# Patient Record
Sex: Female | Born: 1980 | Race: Black or African American | Hispanic: No | Smoking: Never smoker
Health system: Southern US, Community
[De-identification: ages and names within clinical notes are randomized; demographics above are authoritative.]

## PROBLEM LIST (undated history)

## (undated) DIAGNOSIS — N912 Amenorrhea, unspecified: Secondary | ICD-10-CM

## (undated) DIAGNOSIS — D352 Benign neoplasm of pituitary gland: Secondary | ICD-10-CM

## (undated) DIAGNOSIS — I8390 Asymptomatic varicose veins of unspecified lower extremity: Secondary | ICD-10-CM

## (undated) HISTORY — DX: Amenorrhea, unspecified: N91.2

## (undated) HISTORY — PX: TONSILLECTOMY AND ADENOIDECTOMY: SHX28

## (undated) HISTORY — DX: Benign neoplasm of pituitary gland: D35.2

## (undated) HISTORY — PX: TONSILLECTOMY AND ADENOIDECTOMY: SUR1326

## (undated) HISTORY — DX: Asymptomatic varicose veins of unspecified lower extremity: I83.90

---
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
## 2001-10-22 ENCOUNTER — Emergency Department (HOSPITAL_COMMUNITY): Admission: EM | Admit: 2001-10-22 | Discharge: 2001-10-22 | Payer: Self-pay | Admitting: Emergency Medicine

## 2001-10-24 ENCOUNTER — Emergency Department (HOSPITAL_COMMUNITY): Admission: EM | Admit: 2001-10-24 | Discharge: 2001-10-24 | Payer: Self-pay | Admitting: *Deleted

## 2001-11-12 ENCOUNTER — Emergency Department (HOSPITAL_COMMUNITY): Admission: EM | Admit: 2001-11-12 | Discharge: 2001-11-12 | Payer: Self-pay | Admitting: Emergency Medicine

## 2006-06-07 ENCOUNTER — Ambulatory Visit: Payer: Self-pay | Admitting: Cardiology

## 2006-06-19 ENCOUNTER — Ambulatory Visit: Payer: Self-pay

## 2014-06-15 ENCOUNTER — Other Ambulatory Visit (HOSPITAL_COMMUNITY)
Admission: RE | Admit: 2014-06-15 | Discharge: 2014-06-15 | Disposition: A | Discharge status: Home / Self Care | Payer: Managed Care, Other (non HMO) | Source: Ambulatory Visit | Attending: Family Medicine | Admitting: Family Medicine

## 2014-06-15 ENCOUNTER — Ambulatory Visit (INDEPENDENT_AMBULATORY_CARE_PROVIDER_SITE_OTHER): Payer: Managed Care, Other (non HMO) | Admitting: Family Medicine

## 2014-06-15 ENCOUNTER — Encounter: Payer: Self-pay | Admitting: Family Medicine

## 2014-06-15 VITALS — BP 108/72 | HR 88 | Temp 99.4°F | Ht <= 58 in | Wt 181.0 lb

## 2014-06-15 DIAGNOSIS — Z01419 Encounter for gynecological examination (general) (routine) without abnormal findings: Secondary | ICD-10-CM | POA: Insufficient documentation

## 2014-06-15 DIAGNOSIS — Z23 Encounter for immunization: Secondary | ICD-10-CM

## 2014-06-15 DIAGNOSIS — R55 Syncope and collapse: Secondary | ICD-10-CM | POA: Diagnosis not present

## 2014-06-15 DIAGNOSIS — Z0000 Encounter for general adult medical examination without abnormal findings: Secondary | ICD-10-CM | POA: Diagnosis not present

## 2014-06-15 DIAGNOSIS — R8290 Unspecified abnormal findings in urine: Secondary | ICD-10-CM | POA: Diagnosis not present

## 2014-06-15 DIAGNOSIS — N912 Amenorrhea, unspecified: Secondary | ICD-10-CM

## 2014-06-15 NOTE — Patient Instructions (Signed)
Preventive Care for Adults A healthy lifestyle and preventive care can promote health and wellness. Preventive health guidelines for women include the following key practices.  A routine yearly physical is a good way to check with your health care provider about your health and preventive screening. It is a chance to share any concerns and updates on your health and to receive a thorough exam.  Visit your dentist for a routine exam and preventive care every 6 months. Brush your teeth twice a day and floss once a day. Good oral hygiene prevents tooth decay and gum disease.  The frequency of eye exams is based on your age, health, family medical history, use of contact lenses, and other factors. Follow your health care provider's recommendations for frequency of eye exams.  Eat a healthy diet. Foods like vegetables, fruits, whole grains, low-fat dairy products, and lean protein foods contain the nutrients you need without too many calories. Decrease your intake of foods high in solid fats, added sugars, and salt. Eat the right amount of calories for you.Get information about a proper diet from your health care provider, if necessary.  Regular physical exercise is one of the most important things you can do for your health. Most adults should get at least 150 minutes of moderate-intensity exercise (any activity that increases your heart rate and causes you to sweat) each week. In addition, most adults need muscle-strengthening exercises on 2 or more days a week.  Maintain a healthy weight. The body mass index (BMI) is a screening tool to identify possible weight problems. It provides an estimate of body fat based on height and weight. Your health care provider can find your BMI and can help you achieve or maintain a healthy weight.For adults 20 years and older:  A BMI below 18.5 is considered underweight.  A BMI of 18.5 to 24.9 is normal.  A BMI of 25 to 29.9 is considered overweight.  A BMI of  30 and above is considered obese.  Maintain normal blood lipids and cholesterol levels by exercising and minimizing your intake of saturated fat. Eat a balanced diet with plenty of fruit and vegetables. Blood tests for lipids and cholesterol should begin at age 76 and be repeated every 5 years. If your lipid or cholesterol levels are high, you are over 50, or you are at high risk for heart disease, you may need your cholesterol levels checked more frequently.Ongoing high lipid and cholesterol levels should be treated with medicines if diet and exercise are not working.  If you smoke, find out from your health care provider how to quit. If you do not use tobacco, do not start.  Lung cancer screening is recommended for adults aged 22-80 years who are at high risk for developing lung cancer because of a history of smoking. A yearly low-dose CT scan of the lungs is recommended for people who have at least a 30-pack-year history of smoking and are a current smoker or have quit within the past 15 years. A pack year of smoking is smoking an average of 1 pack of cigarettes a day for 1 year (for example: 1 pack a day for 30 years or 2 packs a day for 15 years). Yearly screening should continue until the smoker has stopped smoking for at least 15 years. Yearly screening should be stopped for people who develop a health problem that would prevent them from having lung cancer treatment.  If you are pregnant, do not drink alcohol. If you are breastfeeding,  be very cautious about drinking alcohol. If you are not pregnant and choose to drink alcohol, do not have more than 1 drink per day. One drink is considered to be 12 ounces (355 mL) of beer, 5 ounces (148 mL) of wine, or 1.5 ounces (44 mL) of liquor.  Avoid use of street drugs. Do not share needles with anyone. Ask for help if you need support or instructions about stopping the use of drugs.  High blood pressure causes heart disease and increases the risk of  stroke. Your blood pressure should be checked at least every 1 to 2 years. Ongoing high blood pressure should be treated with medicines if weight loss and exercise do not work.  If you are 3-86 years old, ask your health care provider if you should take aspirin to prevent strokes.  Diabetes screening involves taking a blood sample to check your fasting blood sugar level. This should be done once every 3 years, after age 67, if you are within normal weight and without risk factors for diabetes. Testing should be considered at a younger age or be carried out more frequently if you are overweight and have at least 1 risk factor for diabetes.  Breast cancer screening is essential preventive care for women. You should practice "breast self-awareness." This means understanding the normal appearance and feel of your breasts and may include breast self-examination. Any changes detected, no matter how small, should be reported to a health care provider. Women in their 8s and 30s should have a clinical breast exam (CBE) by a health care provider as part of a regular health exam every 1 to 3 years. After age 70, women should have a CBE every year. Starting at age 25, women should consider having a mammogram (breast X-ray test) every year. Women who have a family history of breast cancer should talk to their health care provider about genetic screening. Women at a high risk of breast cancer should talk to their health care providers about having an MRI and a mammogram every year.  Breast cancer gene (BRCA)-related cancer risk assessment is recommended for women who have family members with BRCA-related cancers. BRCA-related cancers include breast, ovarian, tubal, and peritoneal cancers. Having family members with these cancers may be associated with an increased risk for harmful changes (mutations) in the breast cancer genes BRCA1 and BRCA2. Results of the assessment will determine the need for genetic counseling and  BRCA1 and BRCA2 testing.  Routine pelvic exams to screen for cancer are no longer recommended for nonpregnant women who are considered low risk for cancer of the pelvic organs (ovaries, uterus, and vagina) and who do not have symptoms. Ask your health care provider if a screening pelvic exam is right for you.  If you have had past treatment for cervical cancer or a condition that could lead to cancer, you need Pap tests and screening for cancer for at least 20 years after your treatment. If Pap tests have been discontinued, your risk factors (such as having a new sexual partner) need to be reassessed to determine if screening should be resumed. Some women have medical problems that increase the chance of getting cervical cancer. In these cases, your health care provider may recommend more frequent screening and Pap tests.  The HPV test is an additional test that may be used for cervical cancer screening. The HPV test looks for the virus that can cause the cell changes on the cervix. The cells collected during the Pap test can be  tested for HPV. The HPV test could be used to screen women aged 30 years and older, and should be used in women of any age who have unclear Pap test results. After the age of 30, women should have HPV testing at the same frequency as a Pap test.  Colorectal cancer can be detected and often prevented. Most routine colorectal cancer screening begins at the age of 50 years and continues through age 75 years. However, your health care provider may recommend screening at an earlier age if you have risk factors for colon cancer. On a yearly basis, your health care provider may provide home test kits to check for hidden blood in the stool. Use of a small camera at the end of a tube, to directly examine the colon (sigmoidoscopy or colonoscopy), can detect the earliest forms of colorectal cancer. Talk to your health care provider about this at age 50, when routine screening begins. Direct  exam of the colon should be repeated every 5-10 years through age 75 years, unless early forms of pre-cancerous polyps or small growths are found.  People who are at an increased risk for hepatitis B should be screened for this virus. You are considered at high risk for hepatitis B if:  You were born in a country where hepatitis B occurs often. Talk with your health care provider about which countries are considered high risk.  Your parents were born in a high-risk country and you have not received a shot to protect against hepatitis B (hepatitis B vaccine).  You have HIV or AIDS.  You use needles to inject street drugs.  You live with, or have sex with, someone who has hepatitis B.  You get hemodialysis treatment.  You take certain medicines for conditions like cancer, organ transplantation, and autoimmune conditions.  Hepatitis C blood testing is recommended for all people born from 1945 through 1965 and any individual with known risks for hepatitis C.  Practice safe sex. Use condoms and avoid high-risk sexual practices to reduce the spread of sexually transmitted infections (STIs). STIs include gonorrhea, chlamydia, syphilis, trichomonas, herpes, HPV, and human immunodeficiency virus (HIV). Herpes, HIV, and HPV are viral illnesses that have no cure. They can result in disability, cancer, and death.  You should be screened for sexually transmitted illnesses (STIs) including gonorrhea and chlamydia if:  You are sexually active and are younger than 24 years.  You are older than 24 years and your health care provider tells you that you are at risk for this type of infection.  Your sexual activity has changed since you were last screened and you are at an increased risk for chlamydia or gonorrhea. Ask your health care provider if you are at risk.  If you are at risk of being infected with HIV, it is recommended that you take a prescription medicine daily to prevent HIV infection. This is  called preexposure prophylaxis (PrEP). You are considered at risk if:  You are a heterosexual woman, are sexually active, and are at increased risk for HIV infection.  You take drugs by injection.  You are sexually active with a partner who has HIV.  Talk with your health care provider about whether you are at high risk of being infected with HIV. If you choose to begin PrEP, you should first be tested for HIV. You should then be tested every 3 months for as long as you are taking PrEP.  Osteoporosis is a disease in which the bones lose minerals and strength   with aging. This can result in serious bone fractures or breaks. The risk of osteoporosis can be identified using a bone density scan. Women ages 65 years and over and women at risk for fractures or osteoporosis should discuss screening with their health care providers. Ask your health care provider whether you should take a calcium supplement or vitamin D to reduce the rate of osteoporosis.  Menopause can be associated with physical symptoms and risks. Hormone replacement therapy is available to decrease symptoms and risks. You should talk to your health care provider about whether hormone replacement therapy is right for you.  Use sunscreen. Apply sunscreen liberally and repeatedly throughout the day. You should seek shade when your shadow is shorter than you. Protect yourself by wearing long sleeves, pants, a wide-brimmed hat, and sunglasses year round, whenever you are outdoors.  Once a month, do a whole body skin exam, using a mirror to look at the skin on your back. Tell your health care provider of new moles, moles that have irregular borders, moles that are larger than a pencil eraser, or moles that have changed in shape or color.  Stay current with required vaccines (immunizations).  Influenza vaccine. All adults should be immunized every year.  Tetanus, diphtheria, and acellular pertussis (Td, Tdap) vaccine. Pregnant women should  receive 1 dose of Tdap vaccine during each pregnancy. The dose should be obtained regardless of the length of time since the last dose. Immunization is preferred during the 27th-36th week of gestation. An adult who has not previously received Tdap or who does not know her vaccine status should receive 1 dose of Tdap. This initial dose should be followed by tetanus and diphtheria toxoids (Td) booster doses every 10 years. Adults with an unknown or incomplete history of completing a 3-dose immunization series with Td-containing vaccines should begin or complete a primary immunization series including a Tdap dose. Adults should receive a Td booster every 10 years.  Varicella vaccine. An adult without evidence of immunity to varicella should receive 2 doses or a second dose if she has previously received 1 dose. Pregnant females who do not have evidence of immunity should receive the first dose after pregnancy. This first dose should be obtained before leaving the health care facility. The second dose should be obtained 4-8 weeks after the first dose.  Human papillomavirus (HPV) vaccine. Females aged 13-26 years who have not received the vaccine previously should obtain the 3-dose series. The vaccine is not recommended for use in pregnant females. However, pregnancy testing is not needed before receiving a dose. If a female is found to be pregnant after receiving a dose, no treatment is needed. In that case, the remaining doses should be delayed until after the pregnancy. Immunization is recommended for any person with an immunocompromised condition through the age of 26 years if she did not get any or all doses earlier. During the 3-dose series, the second dose should be obtained 4-8 weeks after the first dose. The third dose should be obtained 24 weeks after the first dose and 16 weeks after the second dose.  Zoster vaccine. One dose is recommended for adults aged 60 years or older unless certain conditions are  present.  Measles, mumps, and rubella (MMR) vaccine. Adults born before 1957 generally are considered immune to measles and mumps. Adults born in 1957 or later should have 1 or more doses of MMR vaccine unless there is a contraindication to the vaccine or there is laboratory evidence of immunity to   each of the three diseases. A routine second dose of MMR vaccine should be obtained at least 28 days after the first dose for students attending postsecondary schools, health care workers, or international travelers. People who received inactivated measles vaccine or an unknown type of measles vaccine during 1963-1967 should receive 2 doses of MMR vaccine. People who received inactivated mumps vaccine or an unknown type of mumps vaccine before 1979 and are at high risk for mumps infection should consider immunization with 2 doses of MMR vaccine. For females of childbearing age, rubella immunity should be determined. If there is no evidence of immunity, females who are not pregnant should be vaccinated. If there is no evidence of immunity, females who are pregnant should delay immunization until after pregnancy. Unvaccinated health care workers born before 1957 who lack laboratory evidence of measles, mumps, or rubella immunity or laboratory confirmation of disease should consider measles and mumps immunization with 2 doses of MMR vaccine or rubella immunization with 1 dose of MMR vaccine.  Pneumococcal 13-valent conjugate (PCV13) vaccine. When indicated, a person who is uncertain of her immunization history and has no record of immunization should receive the PCV13 vaccine. An adult aged 19 years or older who has certain medical conditions and has not been previously immunized should receive 1 dose of PCV13 vaccine. This PCV13 should be followed with a dose of pneumococcal polysaccharide (PPSV23) vaccine. The PPSV23 vaccine dose should be obtained at least 8 weeks after the dose of PCV13 vaccine. An adult aged 19  years or older who has certain medical conditions and previously received 1 or more doses of PPSV23 vaccine should receive 1 dose of PCV13. The PCV13 vaccine dose should be obtained 1 or more years after the last PPSV23 vaccine dose.  Pneumococcal polysaccharide (PPSV23) vaccine. When PCV13 is also indicated, PCV13 should be obtained first. All adults aged 65 years and older should be immunized. An adult younger than age 65 years who has certain medical conditions should be immunized. Any person who resides in a nursing home or long-term care facility should be immunized. An adult smoker should be immunized. People with an immunocompromised condition and certain other conditions should receive both PCV13 and PPSV23 vaccines. People with human immunodeficiency virus (HIV) infection should be immunized as soon as possible after diagnosis. Immunization during chemotherapy or radiation therapy should be avoided. Routine use of PPSV23 vaccine is not recommended for American Indians, [place] Natives, or people younger than 65 years unless there are medical conditions that require PPSV23 vaccine. When indicated, people who have unknown immunization and have no record of immunization should receive PPSV23 vaccine. One-time revaccination 5 years after the first dose of PPSV23 is recommended for people aged 19-64 years who have chronic kidney failure, nephrotic syndrome, asplenia, or immunocompromised conditions. People who received 1-2 doses of PPSV23 before age 65 years should receive another dose of PPSV23 vaccine at age 65 years or later if at least 5 years have passed since the previous dose. Doses of PPSV23 are not needed for people immunized with PPSV23 at or after age 65 years.  Meningococcal vaccine. Adults with asplenia or persistent complement component deficiencies should receive 2 doses of quadrivalent meningococcal conjugate (MenACWY-D) vaccine. The doses should be obtained at least 2 months apart.  Microbiologists working with certain meningococcal bacteria, military recruits, people at risk during an outbreak, and people who travel to or live in countries with a high rate of meningitis should be immunized. A first-year college student up through age   21 years who is living in a residence hall should receive a dose if she did not receive a dose on or after her 16th birthday. Adults who have certain high-risk conditions should receive one or more doses of vaccine.  Hepatitis A vaccine. Adults who wish to be protected from this disease, have certain high-risk conditions, work with hepatitis A-infected animals, work in hepatitis A research labs, or travel to or work in countries with a high rate of hepatitis A should be immunized. Adults who were previously unvaccinated and who anticipate close contact with an international adoptee during the first 60 days after arrival in the [place] from a country with a high rate of hepatitis A should be immunized.  Hepatitis B vaccine. Adults who wish to be protected from this disease, have certain high-risk conditions, may be exposed to blood or other infectious body fluids, are household contacts or sex partners of hepatitis B positive people, are clients or workers in certain care facilities, or travel to or work in countries with a high rate of hepatitis B should be immunized.  Haemophilus influenzae type b (Hib) vaccine. A previously unvaccinated person with asplenia or sickle cell disease or having a scheduled splenectomy should receive 1 dose of Hib vaccine. Regardless of previous immunization, a recipient of a hematopoietic stem cell transplant should receive a 3-dose series 6-12 months after her successful transplant. Hib vaccine is not recommended for adults with HIV infection. Preventive Services / Frequency Ages 64 to 68 years  Blood pressure check.** / Every 1 to 2 years.  Lipid and cholesterol check.** / Every 5 years beginning at age  22.  Clinical breast exam.** / Every 3 years for women in their 88s and 53s.  BRCA-related cancer risk assessment.** / For women who have family members with a BRCA-related cancer (breast, ovarian, tubal, or peritoneal cancers).  Pap test.** / Every 2 years from ages 90 through 51. Every 3 years starting at age 21 through age 56 or 3 with a history of 3 consecutive normal Pap tests.  HPV screening.** / Every 3 years from ages 24 through ages 1 to 46 with a history of 3 consecutive normal Pap tests.  Hepatitis C blood test.** / For any individual with known risks for hepatitis C.  Skin self-exam. / Monthly.  Influenza vaccine. / Every year.  Tetanus, diphtheria, and acellular pertussis (Tdap, Td) vaccine.** / Consult your health care provider. Pregnant women should receive 1 dose of Tdap vaccine during each pregnancy. 1 dose of Td every 10 years.  Varicella vaccine.** / Consult your health care provider. Pregnant females who do not have evidence of immunity should receive the first dose after pregnancy.  HPV vaccine. / 3 doses over 6 months, if 72 and younger. The vaccine is not recommended for use in pregnant females. However, pregnancy testing is not needed before receiving a dose.  Measles, mumps, rubella (MMR) vaccine.** / You need at least 1 dose of MMR if you were born in 1957 or later. You may also need a 2nd dose. For females of childbearing age, rubella immunity should be determined. If there is no evidence of immunity, females who are not pregnant should be vaccinated. If there is no evidence of immunity, females who are pregnant should delay immunization until after pregnancy.  Pneumococcal 13-valent conjugate (PCV13) vaccine.** / Consult your health care provider.  Pneumococcal polysaccharide (PPSV23) vaccine.** / 1 to 2 doses if you smoke cigarettes or if you have certain conditions.  Meningococcal vaccine.** /  1 dose if you are age 19 to 21 years and a first-year college  student living in a residence hall, or have one of several medical conditions, you need to get vaccinated against meningococcal disease. You may also need additional booster doses.  Hepatitis A vaccine.** / Consult your health care provider.  Hepatitis B vaccine.** / Consult your health care provider.  Haemophilus influenzae type b (Hib) vaccine.** / Consult your health care provider. Ages 40 to 64 years  Blood pressure check.** / Every 1 to 2 years.  Lipid and cholesterol check.** / Every 5 years beginning at age 20 years.  Lung cancer screening. / Every year if you are aged 55-80 years and have a 30-pack-year history of smoking and currently smoke or have quit within the past 15 years. Yearly screening is stopped once you have quit smoking for at least 15 years or develop a health problem that would prevent you from having lung cancer treatment.  Clinical breast exam.** / Every year after age 40 years.  BRCA-related cancer risk assessment.** / For women who have family members with a BRCA-related cancer (breast, ovarian, tubal, or peritoneal cancers).  Mammogram.** / Every year beginning at age 40 years and continuing for as long as you are in good health. Consult with your health care provider.  Pap test.** / Every 3 years starting at age 30 years through age 65 or 70 years with a history of 3 consecutive normal Pap tests.  HPV screening.** / Every 3 years from ages 30 years through ages 65 to 70 years with a history of 3 consecutive normal Pap tests.  Fecal occult blood test (FOBT) of stool. / Every year beginning at age 50 years and continuing until age 75 years. You may not need to do this test if you get a colonoscopy every 10 years.  Flexible sigmoidoscopy or colonoscopy.** / Every 5 years for a flexible sigmoidoscopy or every 10 years for a colonoscopy beginning at age 50 years and continuing until age 75 years.  Hepatitis C blood test.** / For all people born from 1945 through  1965 and any individual with known risks for hepatitis C.  Skin self-exam. / Monthly.  Influenza vaccine. / Every year.  Tetanus, diphtheria, and acellular pertussis (Tdap/Td) vaccine.** / Consult your health care provider. Pregnant women should receive 1 dose of Tdap vaccine during each pregnancy. 1 dose of Td every 10 years.  Varicella vaccine.** / Consult your health care provider. Pregnant females who do not have evidence of immunity should receive the first dose after pregnancy.  Zoster vaccine.** / 1 dose for adults aged 60 years or older.  Measles, mumps, rubella (MMR) vaccine.** / You need at least 1 dose of MMR if you were born in 1957 or later. You may also need a 2nd dose. For females of childbearing age, rubella immunity should be determined. If there is no evidence of immunity, females who are not pregnant should be vaccinated. If there is no evidence of immunity, females who are pregnant should delay immunization until after pregnancy.  Pneumococcal 13-valent conjugate (PCV13) vaccine.** / Consult your health care provider.  Pneumococcal polysaccharide (PPSV23) vaccine.** / 1 to 2 doses if you smoke cigarettes or if you have certain conditions.  Meningococcal vaccine.** / Consult your health care provider.  Hepatitis A vaccine.** / Consult your health care provider.  Hepatitis B vaccine.** / Consult your health care provider.  Haemophilus influenzae type b (Hib) vaccine.** / Consult your health care provider. Ages 65   years and over  Blood pressure check.** / Every 1 to 2 years.  Lipid and cholesterol check.** / Every 5 years beginning at age 22 years.  Lung cancer screening. / Every year if you are aged 73-80 years and have a 30-pack-year history of smoking and currently smoke or have quit within the past 15 years. Yearly screening is stopped once you have quit smoking for at least 15 years or develop a health problem that would prevent you from having lung cancer  treatment.  Clinical breast exam.** / Every year after age 4 years.  BRCA-related cancer risk assessment.** / For women who have family members with a BRCA-related cancer (breast, ovarian, tubal, or peritoneal cancers).  Mammogram.** / Every year beginning at age 40 years and continuing for as long as you are in good health. Consult with your health care provider.  Pap test.** / Every 3 years starting at age 9 years through age 34 or 91 years with 3 consecutive normal Pap tests. Testing can be stopped between 65 and 70 years with 3 consecutive normal Pap tests and no abnormal Pap or HPV tests in the past 10 years.  HPV screening.** / Every 3 years from ages 57 years through ages 64 or 45 years with a history of 3 consecutive normal Pap tests. Testing can be stopped between 65 and 70 years with 3 consecutive normal Pap tests and no abnormal Pap or HPV tests in the past 10 years.  Fecal occult blood test (FOBT) of stool. / Every year beginning at age 15 years and continuing until age 17 years. You may not need to do this test if you get a colonoscopy every 10 years.  Flexible sigmoidoscopy or colonoscopy.** / Every 5 years for a flexible sigmoidoscopy or every 10 years for a colonoscopy beginning at age 86 years and continuing until age 71 years.  Hepatitis C blood test.** / For all people born from [birth year] through 1964 and any individual with known risks for hepatitis C.  Osteoporosis screening.** / A one-time screening for women ages 83 years and over and women at risk for fractures or osteoporosis.  Skin self-exam. / Monthly.  Influenza vaccine. / Every year.  Tetanus, diphtheria, and acellular pertussis (Tdap/Td) vaccine.** / 1 dose of Td every 10 years.  Varicella vaccine.** / Consult your health care provider.  Zoster vaccine.** / 1 dose for adults aged 61 years or older.  Pneumococcal 13-valent conjugate (PCV13) vaccine.** / Consult your health care provider.  Pneumococcal  polysaccharide (PPSV23) vaccine.** / 1 dose for all adults aged 28 years and older.  Meningococcal vaccine.** / Consult your health care provider.  Hepatitis A vaccine.** / Consult your health care provider.  Hepatitis B vaccine.** / Consult your health care provider.  Haemophilus influenzae type b (Hib) vaccine.** / Consult your health care provider. ** Family history and personal history of risk and conditions may change your health care provider's recommendations. Document Released: 04/03/2001 Document Revised: 06/22/2013 Document Reviewed: 07/03/2010 [facility] Patient Information 2015 [place], [place]. This information is not intended to replace advice given to you by your health care provider. Make sure you discuss any questions you have with your health care provider.

## 2014-06-15 NOTE — Progress Notes (Signed)
Subjective:     Melissa Spears is a 34 y.o. female and is here for a comprehensive physical exam. The patient reports hx of blacking out -- occurs several times over the years.  Pt was out for a few hours each time   Pt has never seen a specialist.  Has been to ER every time-- in [place].  When she wakes up she shakes.    History   Social History  . Marital Status: Single    Spouse Name: N/A  . Number of Children: N/A  . Years of Education: N/A   Occupational History  . 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 [employer] --[employer] , [employer]     Social History Main Topics  . Smoking status: Never Smoker   . Smokeless tobacco: Never Used  . Alcohol Use: No  . Drug Use: No  . Sexual Activity: Not on file   Other Topics Concern  . Not on file   Social History Narrative   Exercise-- tae bo   Health Maintenance  Topic Date Due  . PAP SMEAR  02/14/2000  . HIV Screening  06/15/2015 (Originally 02/13/1997)  . INFLUENZA VACCINE  09/20/2014  . TETANUS/TDAP  06/14/2024    The following portions of the patient's history were reviewed and updated as appropriate:  She  has a past medical history of Amenorrhea (2000) and Varicose veins. She  does not have any pertinent problems on file. She  has past surgical history that includes Tonsillectomy and adenoidectomy. Her family history includes Diabetes in her mother; Heart attack (age of onset: 16) in her mother; Heart failure in her brother; Heart murmur in her brother; Hypertension in her mother; Hypotension in her brother. She  reports that she has never smoked. She has never used smokeless tobacco. She reports that she does not drink alcohol or use illicit drugs. She currently has no medications in their medication list. No current outpatient prescriptions on file prior to visit.   No current facility-administered medications on file prior to visit.   She has No Known Allergies..  Review of Systems Review of Systems  Constitutional:  Negative for activity change, appetite change and fatigue.  HENT: Negative for hearing loss, congestion, tinnitus and ear discharge.  dentist q47m Eyes: Negative for visual disturbance (see optho q1y -- vision corrected to 20/20 with glasses).  Respiratory: Negative for cough, chest tightness and shortness of breath.   Cardiovascular: Negative for chest pain, palpitations and leg swelling.  Gastrointestinal: Negative for abdominal pain, diarrhea, constipation and abdominal distention.  Genitourinary: Negative for urgency, frequency, decreased urine volume and difficulty urinating.  Musculoskeletal: Negative for back pain, arthralgias and gait problem.  Skin: Negative for color change, pallor and rash.  Neurological: Negative for dizziness, light-headedness, numbness and headaches.  Hematological: Negative for adenopathy. Does not bruise/bleed easily.  Psychiatric/Behavioral: Negative for suicidal ideas, confusion, sleep disturbance, self-injury, dysphoric mood, decreased concentration and agitation.       Objective:    BP 108/72 mmHg  Pulse 88  Temp(Src) 99.4 F (37.4 C) (Oral)  Ht 4\' 9"  (1.448 m)  Wt 181 lb (82.101 kg)  BMI 39.16 kg/m2  SpO2 98%  LMP  General appearance: alert, cooperative, appears stated age and no distress Head: Normocephalic, without obvious abnormality, atraumatic Eyes: conjunctivae/corneas clear. PERRL, EOM's intact. Fundi benign. Ears: normal TM's and external ear canals both ears Nose: Nares normal. Septum midline. Mucosa normal. No drainage or sinus tenderness. Throat: lips, mucosa, and tongue normal; teeth and gums  normal Neck: no adenopathy, no carotid bruit, no JVD, supple, symmetrical, trachea midline and thyroid not enlarged, symmetric, no tenderness/mass/nodules Back: symmetric, no curvature. ROM normal. No CVA tenderness. Lungs: clear to auscultation bilaterally Breasts: normal appearance, no masses or tenderness Heart: regular rate and rhythm,  S1, S2 normal, no murmur, click, rub or gallop Abdomen: soft, non-tender; bowel sounds normal; no masses,  no organomegaly Pelvic: cervix normal in appearance, external genitalia normal, no adnexal masses or tenderness, no cervical motion tenderness, rectovaginal septum normal, uterus normal size, shape, and consistency and vagina normal without discharge Extremities: extremities normal, atraumatic, no cyanosis or edema Pulses: 2+ and symmetric Skin: Skin color, texture, turgor normal. No rashes or lesions Lymph nodes: Cervical, supraclavicular, and axillary nodes normal. Neurologic: Alert and oriented X 3, normal strength and tone. Normal symmetric reflexes. Normal coordination and gait Psych-- no anxiety no depression      Assessment:    Healthy female exam.      Plan:  ghm utd Check labs   See After Visit Summary for Counseling Recommendations    1. Preventative health care  - Basic metabolic panel - CBC with Differential/Platelet - Hemoglobin A1c - Hepatic function panel - Lipid panel - Microalbumin / creatinine urine ratio - POCT urinalysis dipstick - TSH - Tdap vaccine greater than or equal to 7yo IM - Cytology - PAP  2. Amenorrhea  - Testosterone, Free, Total, SHBG - POCT urine pregnancy - Ambulatory referral to Gynecology

## 2014-06-15 NOTE — Progress Notes (Signed)
Pre visit review using our clinic review tool, if applicable. No additional management support is needed unless otherwise documented below in the visit note. 

## 2014-06-15 NOTE — Assessment & Plan Note (Signed)
Pt states she has had full w/u with other drs--- we will need to get records  Consider neuro referral

## 2014-06-16 LAB — HEPATIC FUNCTION PANEL
ALK PHOS: 85 U/L (ref 39–117)
ALT: 19 U/L (ref 0–35)
AST: 21 U/L (ref 0–37)
Albumin: 4.1 g/dL (ref 3.5–5.2)
BILIRUBIN DIRECT: 0 mg/dL (ref 0.0–0.3)
TOTAL PROTEIN: 7.9 g/dL (ref 6.0–8.3)
Total Bilirubin: 0.3 mg/dL (ref 0.2–1.2)

## 2014-06-16 LAB — BASIC METABOLIC PANEL
BUN: 12 mg/dL (ref 6–23)
CO2: 29 mEq/L (ref 19–32)
CREATININE: 1.19 mg/dL (ref 0.40–1.20)
Calcium: 9.9 mg/dL (ref 8.4–10.5)
Chloride: 102 mEq/L (ref 96–112)
GFR: 67.46 mL/min (ref >60.00)
Glucose, Bld: 70 mg/dL (ref 70–99)
Potassium: 3.7 mEq/L (ref 3.5–5.1)
Sodium: 138 mEq/L (ref 135–145)

## 2014-06-16 LAB — HEMOGLOBIN A1C: Hgb A1c MFr Bld: 5.9 % (ref 4.6–6.5)

## 2014-06-16 LAB — CBC WITH DIFFERENTIAL/PLATELET
BASOS ABS: 0.1 10*3/uL (ref 0.0–0.1)
Basophils Relative: 1.5 % (ref 0.0–3.0)
Eosinophils Absolute: 0.1 10*3/uL (ref 0.0–0.7)
Eosinophils Relative: 1.4 % (ref 0.0–5.0)
HEMATOCRIT: 38.8 % (ref 36.0–46.0)
Hemoglobin: 13.3 g/dL (ref 12.0–15.0)
LYMPHS ABS: 3.1 10*3/uL (ref 0.7–4.0)
Lymphocytes Relative: 40.2 % (ref 12.0–46.0)
MCHC: 34.2 g/dL (ref 30.0–36.0)
MCV: 85.1 fl (ref 78.0–100.0)
MONOS PCT: 9.5 % (ref 3.0–12.0)
Monocytes Absolute: 0.7 10*3/uL (ref 0.1–1.0)
NEUTROS PCT: 47.4 % (ref 43.0–77.0)
Neutro Abs: 3.7 10*3/uL (ref 1.4–7.7)
PLATELETS: 448 10*3/uL — AB (ref 150.0–400.0)
RBC: 4.56 Mil/uL (ref 3.87–5.11)
RDW: 12.7 % (ref 11.5–15.5)
WBC: 7.8 10*3/uL (ref 4.0–10.5)

## 2014-06-16 LAB — TSH: TSH: 1.15 u[IU]/mL (ref 0.35–4.50)

## 2014-06-16 LAB — MICROALBUMIN / CREATININE URINE RATIO
CREATININE, U: 267.4 mg/dL
MICROALB UR: 3.1 mg/dL — AB (ref 0.0–1.9)
Microalb Creat Ratio: 1.2 mg/g (ref 0.0–30.0)

## 2014-06-16 LAB — LIPID PANEL
Cholesterol: 161 mg/dL (ref 0–200)
HDL: 49.4 mg/dL (ref >39.00)
LDL CALC: 100 mg/dL — AB (ref 0–99)
NONHDL: 111.6
TRIGLYCERIDES: 60 mg/dL (ref 0.0–149.0)
Total CHOL/HDL Ratio: 3
VLDL: 12 mg/dL (ref 0.0–40.0)

## 2014-06-16 LAB — POCT URINALYSIS DIPSTICK
BILIRUBIN UA: NEGATIVE
Glucose, UA: NEGATIVE
KETONES UA: NEGATIVE
Leukocytes, UA: NEGATIVE
Nitrite, UA: NEGATIVE
PH UA: 6
RBC UA: NEGATIVE
Spec Grav, UA: 1.02
Urobilinogen, UA: 0.2

## 2014-06-16 LAB — TESTOSTERONE, FREE, TOTAL, SHBG
SEX HORMONE BINDING: 22 nmol/L (ref 17–124)
TESTOSTERONE-% FREE: 2.3 % (ref 0.4–2.4)
TESTOSTERONE: 64 ng/dL (ref 10–70)
Testosterone, Free: 14.4 pg/mL — ABNORMAL HIGH (ref 0.6–6.8)

## 2014-06-16 LAB — POCT URINE PREGNANCY: PREG TEST UR: NEGATIVE

## 2014-06-16 NOTE — Addendum Note (Signed)
Addended by: Peggyann Shoals on: 06/16/2014 11:46 AM   Modules accepted: Orders

## 2014-06-17 LAB — CYTOLOGY - PAP

## 2014-06-18 LAB — URINE CULTURE
Colony Count: NO GROWTH
ORGANISM ID, BACTERIA: NO GROWTH

## 2014-06-25 ENCOUNTER — Other Ambulatory Visit: Payer: Self-pay | Admitting: Nurse Practitioner

## 2014-06-25 DIAGNOSIS — E229 Hyperfunction of pituitary gland, unspecified: Principal | ICD-10-CM

## 2014-06-25 DIAGNOSIS — R7989 Other specified abnormal findings of blood chemistry: Secondary | ICD-10-CM

## 2014-06-25 DIAGNOSIS — N912 Amenorrhea, unspecified: Secondary | ICD-10-CM

## 2014-07-07 ENCOUNTER — Ambulatory Visit
Admission: RE | Admit: 2014-07-07 | Discharge: 2014-07-07 | Disposition: A | Discharge status: Home / Self Care | Payer: Managed Care, Other (non HMO) | Source: Ambulatory Visit | Attending: Nurse Practitioner | Admitting: Nurse Practitioner

## 2014-07-07 DIAGNOSIS — E229 Hyperfunction of pituitary gland, unspecified: Principal | ICD-10-CM

## 2014-07-07 DIAGNOSIS — N912 Amenorrhea, unspecified: Secondary | ICD-10-CM

## 2014-07-07 DIAGNOSIS — R7989 Other specified abnormal findings of blood chemistry: Secondary | ICD-10-CM

## 2014-07-07 MED ORDER — GADOBENATE DIMEGLUMINE 529 MG/ML IV SOLN
8.0000 mL | Freq: Once | INTRAVENOUS | Status: AC | PRN
  Administered 2014-07-07: 8 mL via INTRAVENOUS

## 2014-10-21 ENCOUNTER — Ambulatory Visit (INDEPENDENT_AMBULATORY_CARE_PROVIDER_SITE_OTHER): Payer: Managed Care, Other (non HMO) | Admitting: Medical

## 2014-10-21 ENCOUNTER — Encounter: Payer: Self-pay | Admitting: Medical

## 2014-10-21 VITALS — BP 116/78 | HR 90 | Temp 99.0°F | Resp 16 | Ht <= 58 in | Wt 186.4 lb

## 2014-10-21 DIAGNOSIS — N910 Primary amenorrhea: Secondary | ICD-10-CM | POA: Diagnosis not present

## 2014-10-21 DIAGNOSIS — M542 Cervicalgia: Secondary | ICD-10-CM

## 2014-10-21 DIAGNOSIS — N926 Irregular menstruation, unspecified: Secondary | ICD-10-CM

## 2014-10-21 LAB — POCT URINE PREGNANCY: Preg Test, Ur: NEGATIVE

## 2014-10-21 MED ORDER — CYCLOBENZAPRINE HCL 10 MG PO TABS: 10.0000 mg | ORAL_TABLET | Freq: Every day | ORAL | Status: DC

## 2014-10-21 MED ORDER — DICLOFENAC SODIUM 75 MG PO TBEC: 75.0000 mg | DELAYED_RELEASE_TABLET | Freq: Two times a day (BID) | ORAL | Status: DC

## 2014-10-21 NOTE — Patient Instructions (Addendum)
Will get c-spine xray.  Rx diclofenac.  Rx flexeril 10 mg at night.  May refer you to PT.  Follow up in 7-10 days or as needed  If symptoms worsen or change notify us.

## 2014-10-21 NOTE — Progress Notes (Signed)
Subjective:    Patient ID: Melissa Spears, female    DOB: 1980-08-29, 34 y.o.   MRN: [id number]  HPI  Pt in with some neck pain. She states has history  trapezius tightness that radiates to base of her head. This pain had been going on for 3 months. 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 Pt has told endocrinologist that she associates pain with use of med. But no adjustment of cabergoline made.  Pt left side of neck hurts when she turns her head. No pain shooting down her arms.  Sometime if neck pain hurts bad enough lt tmj will hurt and get ha as well.  Pt states started prolactinoma meds in may.   Pt states endocrinologist NP gave tramadol for the pain. But no muscle relaxant given. Pt has been to chiropracter but does not help with neck pain.  LMP- July 7-21(States since started med for prolactinoma told menses would be irregualr.)   Review of Systems  Constitutional: Negative for fever, chills and fatigue.  Eyes: Negative for pain and visual disturbance.  Respiratory: Negative for chest tightness, shortness of breath and wheezing.   Cardiovascular: Negative for chest pain and palpitations.  Gastrointestinal: Negative for abdominal pain.  Musculoskeletal: Positive for neck pain. Negative for back pain.  Skin: Negative for pallor.  Neurological: Negative for dizziness, syncope, weakness, light-headedness, numbness and headaches.       None presently.  Hematological: Negative for adenopathy. Does not bruise/bleed easily.  Psychiatric/Behavioral: Negative for behavioral problems and confusion.    Past Medical History  Diagnosis Date  . Amenorrhea 2000    Unknown cause  . Varicose veins     Social History   Social History  . Marital Status: Single    Spouse Name: N/A  . Number of Children: N/A  . Years of Education: N/A   Occupational History  . 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 Smoking status: Never Smoker   . Smokeless tobacco: Never Used  . Alcohol Use: No  . Drug Use: No  . Sexual Activity: Not on file   Other Topics Concern  . Not on file   Social History Narrative   Exercise-- tae bo    Past Surgical History  Procedure Laterality Date  . Tonsillectomy and adenoidectomy      Family History  Problem Relation Age of Onset  . Heart attack Mother 22  . Diabetes Mother   . Hypertension Mother   . Heart failure Brother   . Hypotension Brother   . Heart murmur Brother     No Known Allergies  No current outpatient prescriptions on file prior to visit.   No current facility-administered medications on file prior to visit.    BP 116/78 mmHg  Pulse 90  Temp(Src) 99 F (37.2 C) (Oral)  Resp 16  Ht 4\' 9"  (1.448 m)  Wt 186 lb 6.4 oz (84.55 kg)  BMI 40.33 kg/m2  SpO2 98%      Objective:   Physical Exam  General Mental Status- Alert. General Appearance- Not in acute distress.   Skin General: Color- Normal Color. Moisture- Normal Moisture.  Neck Carotid Arteries- Normal color. Moisture- Normal Moisture. No carotid bruits. Faint mid cspine tenderness. Left trapezius is tender throughout. More so toward base of the cranium lt side.  Chest and Lung Exam Auscultation: Breath Sounds:-Normal. CTA.  Cardiovascular Auscultation:Rythm- Regular.  Murmurs & Other Heart Sounds:Auscultation of the heart reveals- No Murmurs.  Abdomen Inspection:-Inspeection Normal. Palpation/Percussion:Note:No mass. Palpation and Percussion of the abdomen reveal- Non Tender, Non Distended + BS, no rebound or guarding.    Neurologic Cranial Nerve exam:- CN III-XII intact(No nystagmus), symmetric smile. Strength:- 5/5 equal and symmetric strength both upper and lower extremities.      Assessment & Plan:  Will get c-spine xray.  Rx diclofenac.  Rx flexeril 10 mg at night.  May refer you to PT.  Follow up in 7-10 days or as needed

## 2014-10-21 NOTE — Progress Notes (Signed)
Pre visit review using our clinic review tool, if applicable. No additional management support is needed unless otherwise documented below in the visit note. 

## 2014-10-26 ENCOUNTER — Ambulatory Visit (HOSPITAL_BASED_OUTPATIENT_CLINIC_OR_DEPARTMENT_OTHER)
Admission: RE | Admit: 2014-10-26 | Discharge: 2014-10-26 | Disposition: A | Discharge status: Home / Self Care | Payer: Managed Care, Other (non HMO) | Source: Ambulatory Visit | Attending: Medical | Admitting: Medical

## 2014-10-26 ENCOUNTER — Encounter: Payer: Self-pay | Admitting: Family

## 2014-10-26 DIAGNOSIS — M542 Cervicalgia: Secondary | ICD-10-CM

## 2014-10-26 DIAGNOSIS — M25512 Pain in left shoulder: Secondary | ICD-10-CM | POA: Diagnosis present

## 2014-11-02 ENCOUNTER — Telehealth: Payer: Self-pay | Admitting: Family Medicine

## 2014-11-02 NOTE — Telephone Encounter (Signed)
Caller name: Relationship to patient: Can be reached: Pharmacy: [name] [place]  Reason for call: Pt needing results from Xray 10/26/14 and refills on tramadol, cyclobenzaprine, and diclofenac as well.

## 2014-11-03 MED ORDER — TRAMADOL HCL 50 MG PO TABS: 50.0000 mg | ORAL_TABLET | Freq: Four times a day (QID) | ORAL | Status: DC | PRN

## 2014-11-03 MED ORDER — CYCLOBENZAPRINE HCL 10 MG PO TABS: 10.0000 mg | ORAL_TABLET | Freq: Every day | ORAL | Status: DC

## 2014-11-03 MED ORDER — DICLOFENAC SODIUM 75 MG PO TBEC: 75.0000 mg | DELAYED_RELEASE_TABLET | Freq: Two times a day (BID) | ORAL | Status: DC

## 2014-11-03 NOTE — Telephone Encounter (Signed)
Pt refill of med given. But she needs to have follow up appointment. I won't give further refills without follow up.

## 2014-11-03 NOTE — Telephone Encounter (Signed)
Spoke with pt and she voices understanding. Pt has follow up appt 11/22/14 with PCP.

## 2014-11-03 NOTE — Telephone Encounter (Signed)
Pt was seen 10/21/14,  Tramadol last filled 10/21/14,  Cyclobenzaprine filled 10/21/14 Diclofanac filled 10/21/14.  Please advise on refills.

## 2014-11-10 ENCOUNTER — Ambulatory Visit (INDEPENDENT_AMBULATORY_CARE_PROVIDER_SITE_OTHER): Payer: Managed Care, Other (non HMO) | Admitting: Medical

## 2014-11-10 ENCOUNTER — Encounter: Payer: Self-pay | Admitting: Medical

## 2014-11-10 VITALS — BP 120/80 | HR 88 | Temp 98.1°F | Ht <= 58 in | Wt 188.0 lb

## 2014-11-10 DIAGNOSIS — M542 Cervicalgia: Secondary | ICD-10-CM

## 2014-11-10 MED ORDER — TIZANIDINE HCL 6 MG PO CAPS: ORAL_CAPSULE | ORAL | Status: DC

## 2014-11-10 MED ORDER — TRAMADOL HCL 50 MG PO TABS
ORAL_TABLET | ORAL | Status: DC
Start: 2014-11-10 — End: 2019-08-06
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
## 2014-11-10 NOTE — Progress Notes (Signed)
Pre visit review using our clinic review tool, if applicable. No additional management support is needed unless otherwise documented below in the visit note. 

## 2014-11-10 NOTE — Patient Instructions (Addendum)
Will refer you to PT. Recommend stop chiropractor care.  Stop diclofenac and flexeril.  Will advise alleve otc. Rx zanflex and rx tramadol but recommend 2 tab po tid.   Follow up in 3 wks or as needed.

## 2014-11-15 ENCOUNTER — Ambulatory Visit: Payer: Managed Care, Other (non HMO) | Attending: Medical | Admitting: Physical Therapy

## 2014-11-15 DIAGNOSIS — M25512 Pain in left shoulder: Secondary | ICD-10-CM | POA: Insufficient documentation

## 2014-11-15 DIAGNOSIS — M542 Cervicalgia: Secondary | ICD-10-CM | POA: Diagnosis present
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
## 2014-11-17 ENCOUNTER — Ambulatory Visit: Payer: Managed Care, Other (non HMO) | Admitting: Rehabilitation

## 2014-11-17 DIAGNOSIS — M25512 Pain in left shoulder: Secondary | ICD-10-CM

## 2014-11-17 DIAGNOSIS — M542 Cervicalgia: Secondary | ICD-10-CM

## 2014-11-17 NOTE — Therapy (Signed)
[facility] [address]  [place], [place], [address] Phone: [phone]   Fax:  [phone]  Physical Therapy Treatment  Patient Details  Name: Melissa Spears MRN: [id number] Date of Birth: [birth year] Referring Zafir Schauer:  Mackie Pai, PA-C  Encounter Date: 11/17/2014      PT End of Session - 11/17/14 1659    Visit Number 2   Number of Visits 8   Date for PT Re-Evaluation 12/13/14   PT Start Time 1700   PT Stop Time 1744   PT Time Calculation (min) 44 min      Past Medical History  Diagnosis Date  . Amenorrhea 2000    Unknown cause  . Varicose veins     Past Surgical History  Procedure Laterality Date  . Tonsillectomy and adenoidectomy      There were no vitals filed for this visit.  Visit Diagnosis:  Neck pain on left side  Pain in joint, shoulder region, left      Subjective Assessment - 11/17/14 1708    Subjective Wasn't able to complete her shift after last time. Denies pain currently but noted pain "wave" about 15 minutes ago.    Currently in Pain? No/denies                         [facility] Adult PT Treatment/Exercise - 11/17/14 1659    Exercises   Exercises Neck;Shoulder   Neck Exercises: Machines for Strengthening   UBE (Upper Arm Bike) lvl 0 x60"   Neck Exercises: Supine   Other Supine Exercise Scapular retraction with ER iso 10x3"   Modalities   Modalities Electrical Stimulation   Electrical Stimulation   Electrical Stimulation Location Left lateral neck, UT & levator   Electrical Stimulation Parameters IFC - 40% scan, 80-150 Hz, intensity to pt tolerance x10'   Electrical Stimulation Goals Pain   Manual Therapy   Manual Therapy Soft tissue mobilization;Passive ROM   Soft tissue mobilization Gentle STM to bilateral cervical paraspinals, Lt UT, Lt levator, scalenes, proximal rhomboids   Passive ROM Gentle PROM into cervical sidebending and rotation to pt  tolerance.    Neck Exercises: Stretches   Upper Trapezius Stretch 2 reps;20 seconds   Levator Stretch 2 reps;20 seconds   Other Neck Stretches SCM 2x20", seated Rhomboid stretch with pball 2x20"   Other Neck Stretches Childs pose with Pball 2x10" M/L/R                     PT Long Term Goals - 11/17/14 1742    PT LONG TERM GOAL #1   Title Independent with HEP (12/13/14)   Status On-going   PT LONG TERM GOAL #2   Title Patient will report worst pain in neck and left shoulder no greater than 4/10 (12/13/14)   Status On-going   PT LONG TERM GOAL #3   Title Patient will demonstrate left shoulder ROM WFL without increased pain (12/13/14)   Status On-going   PT LONG TERM GOAL #4   Title Patient will report functional use of left UE to perform household ADL's and job tasks without pain interference (12/13/14)   Status On-going               Plan - 11/17/14 1738    Clinical Impression Statement Pt with very limited tolerance to exercises today with pt tearing up during stretches/exercises. Attempted to relieve pain with manual work with good  response by pt. Tried e-stim again today due to pt feeling like it helped last time but then caused pain by possibly being on too long. Did not initiate HEP today or try tape due to very limited tolerance to exercises.    PT Next Visit Plan Initiate HEP, Postural training, Neck/shoulder exercises, modalities PRN, taping for UT/levator        Problem List Patient Active Problem List   Diagnosis Date Noted  . Black-out (not amnesia) 06/15/2014    Barbette Hair, PTA 11/17/2014, 5:42 PM  [facility] [address]  [place], [place], [address] Phone: [phone]   Fax:  [phone]

## 2014-11-22 ENCOUNTER — Ambulatory Visit: Payer: Managed Care, Other (non HMO) | Admitting: Family Medicine

## 2014-11-22 ENCOUNTER — Ambulatory Visit: Payer: Managed Care, Other (non HMO) | Attending: Medical | Admitting: Physical Therapy

## 2014-11-22 DIAGNOSIS — M542 Cervicalgia: Secondary | ICD-10-CM | POA: Diagnosis not present

## 2014-11-22 DIAGNOSIS — M25512 Pain in left shoulder: Secondary | ICD-10-CM | POA: Insufficient documentation

## 2014-11-22 NOTE — Therapy (Signed)
[facility] [address]  [place], [place], [address] Phone: [phone]   Fax:  [phone]  Physical Therapy Treatment  Patient Details  Name: Melissa Spears MRN: [id number] Date of Birth: [birth year] Referring Provider:  Mackie Pai, PA-C  Encounter Date: 11/22/2014      PT End of Session - 11/22/14 0856    Visit Number 3   Number of Visits 8   Date for PT Re-Evaluation 12/13/14   PT Start Time 0845   PT Stop Time 0940   PT Time Calculation (min) 55 min   Activity Tolerance Patient tolerated treatment well   Behavior During Therapy [facility] for tasks assessed/performed      Past Medical History  Diagnosis Date  . Amenorrhea 2000    Unknown cause  . Varicose veins     Past Surgical History  Procedure Laterality Date  . Tonsillectomy and adenoidectomy      There were no vitals filed for this visit.  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 States she has been working on her stretches at home and feels a little more flexible. Not currently having any pain, but reports pain still comes in "waves".   Currently in Pain? No/denies                         [facility] Adult PT Treatment/Exercise - 11/22/14 0845    Exercises   Exercises Neck;Shoulder   Neck Exercises: Machines for Strengthening   UBE (Upper Arm Bike) lvl 0 fwd/back x60" each   Shoulder Exercises: Standing   Row Both;10 reps;Theraband   Theraband Level (Shoulder Row) Level 2 (Red)   Electrical Stimulation   Electrical Stimulation Location Left lateral neck, UT & levator   Electrical Stimulation Parameters IFC - 40% scan, 80-150 Hz, intensity to pt  tolerance x 10 min   Electrical Stimulation Goals Pain   Manual Therapy   Manual Therapy Soft tissue mobilization;Passive ROM;Manual Traction   Soft tissue mobilization Gentle STM to bilateral cervical paraspinals, Lt UT, Lt levator, scalenes, proximal rhomboids   Passive ROM Gentle PROM into cervical sidebending and rotation to pt tolerance.    Manual Traction Gentle manual distraction at ~10 degree angle   Neck Exercises: Stretches   Upper Trapezius Stretch 20 seconds;3 reps   Levator Stretch 20 seconds;3 reps   Other Neck Stretches SCM 2x20" (limited tolerance), seated Rhomboid stretch with pball 2x20"   Other Neck Stretches Childs pose with Pball 2x10" M/L/R                PT Education - 11/22/14 1208    Education Details Initial HEP - stretches, postural stabilization (see Pt instructions)   Person(s) Educated Patient   Methods Explanation;Demonstration;Handout   Comprehension Verbalized understanding;Returned demonstration;Need further instruction             PT Long Term Goals - 11/17/14 1742    PT LONG TERM GOAL #1   Title Independent with HEP (12/13/14)   Status On-going   PT LONG TERM GOAL #2   Title Patient will report worst pain in neck and left shoulder no greater than 4/10 (  12/13/14)   Status On-going   PT LONG TERM GOAL #3   Title Patient will demonstrate left shoulder ROM WFL without increased pain (12/13/14)   Status On-going   PT LONG TERM GOAL #4   Title Patient will report functional use of left UE to perform household ADL's and job tasks without pain interference (12/13/14)   Status On-going               Plan - 11/22/14 1053    Clinical Impression Statement Patient reporting attempting to perform exercises/stretches at home therefore created a basic initial HEP for stretches and postural stabilization with instructions to perform within pain tolerance. Slightly improved tolerance for therapy today, but still significant guarding and  self-limiting ROM. Instructed patient to speak with employer regarding avoiding stocking,especially overhead reaching, and to inform PT if note needed for work.   PT Next Visit Plan Initiate HEP, Postural training, Neck/shoulder exercises, modalities PRN, taping for UT/levator   Consulted and Agree with Plan of Care Patient        Problem List Patient Active Problem List   Diagnosis Date Noted  . Black-out (not amnesia) 06/15/2014    Percival Spanish, PT, MPT 11/22/2014, 12:11 PM  [facility] [address]  [place], [place], [address] Phone: [phone]   Fax:  [phone]

## 2014-11-24 ENCOUNTER — Ambulatory Visit: Payer: Managed Care, Other (non HMO) | Admitting: Rehabilitation

## 2014-11-24 DIAGNOSIS — M25512 Pain in left shoulder: Secondary | ICD-10-CM

## 2014-11-24 DIAGNOSIS — M542 Cervicalgia: Secondary | ICD-10-CM | POA: Diagnosis not present

## 2014-11-24 NOTE — Therapy (Signed)
[facility] [address]  [place], [place], [address] Phone: [phone]   Fax:  [phone]  Physical Therapy Treatment  Patient Details  Name: Melissa Spears MRN: [id number] Date of Birth: 02-May-1980 Referring Provider:  Mackie Pai, PA-C  Encounter Date: 11/24/2014      PT End of Session - 11/24/14 1705    Visit Number 4   Number of Visits 8   Date for PT Re-Evaluation 12/13/14   PT Start Time 3557   PT Stop Time 3220   PT Time Calculation (min) 39 min      Past Medical History  Diagnosis Date  . Amenorrhea 2000    Unknown cause  . Varicose veins     Past Surgical History  Procedure Laterality Date  . Tonsillectomy and adenoidectomy      There were no vitals filed for this visit.  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 May need to get a note to stop them from doing this. Had pain after last time and isn't sure if its the e-stim or manual work. Feels like her ROM is still improving.  Still hasn't figured out what triggers the pain but has noticed it changes with the weather, hot/cold rooms.   Currently in Pain? No/denies                         [facility] Adult PT Treatment/Exercise - 11/24/14 1709    Exercises   Exercises Neck;Shoulder   Neck Exercises: Machines for Strengthening   UBE (Upper Arm Bike) lvl 0 fwd/back x60" each   Neck Exercises: Supine   Neck Retraction 3 secs;10 reps   Shoulder Exercises: Supine   Protraction Both;10 reps   Protraction Limitations Cane chest press   Flexion Both;10 reps   Flexion Limitations Cane pullover   Shoulder Exercises: Standing   Extension Both;10 reps;Theraband   Theraband Level (Shoulder Extension) Level 2 (Red)   Row Both;10  reps;Theraband   Theraband Level (Shoulder Row) Level 2 (Red)   Manual Therapy   Manual Therapy Soft tissue mobilization;Passive ROM   Soft tissue mobilization Gentle STM to bilateral cervical paraspinals, Bil UT, Lt levator, scalenes, proximal rhomboids   Passive ROM Gentle PROM into cervical sidebending and rotation to pt tolerance.    Neck Exercises: Stretches   Upper Trapezius Stretch 2 reps;20 seconds   Levator Stretch 2 reps;20 seconds   Other Neck Stretches SCM 2x20" (limited tolerance), seated Rhomboid stretch with pball 2x20"   Other Neck Stretches Childs pose with Pball 2x10" M/L/R                     PT Long Term Goals - 11/17/14 1742    PT LONG TERM GOAL #1   Title Independent with HEP (12/13/14)   Status On-going   PT LONG TERM GOAL #2   Title Patient will report worst pain in neck and left shoulder no greater than 4/10 (12/13/14)   Status On-going   PT LONG TERM GOAL #3   Title Patient will demonstrate left shoulder ROM WFL without increased pain (12/13/14)   Status On-going   PT LONG TERM GOAL #4   Title Patient will  report functional use of left UE to perform household ADL's and job tasks without pain interference (12/13/14)   Status On-going               Plan - 11/24/14 1745    Clinical Impression Statement Pt declined e-stim today due to wanting to see if that is whats causing flare ups after PT appointments. Will also talk to PT in regards to note for work. Continued with cervical and shoulder AROM exercises today with descent tolerance.    PT Next Visit Plan Postural training, Neck/shoulder exercises, modalities PRN, taping for UT/levator, note for work   Consulted and Agree with Plan of Care Patient        Problem List Patient Active Problem List   Diagnosis Date Noted  . Black-out (not amnesia) 06/15/2014    Barbette Hair, PTA 11/24/2014, 5:47 PM  [facility]  [place], [place], [address] Phone: [phone]   Fax:  [phone]

## 2014-11-29 ENCOUNTER — Ambulatory Visit: Payer: Managed Care, Other (non HMO) | Admitting: Physical Therapy

## 2014-11-29 DIAGNOSIS — M542 Cervicalgia: Secondary | ICD-10-CM

## 2014-11-29 DIAGNOSIS — M25512 Pain in left shoulder: Secondary | ICD-10-CM

## 2014-11-29 NOTE — Therapy (Signed)
[facility] [address]  [place], [place], [address] Phone: [phone]   Fax:  [phone]  Physical Therapy Treatment  Patient Details  Name: Melissa Spears MRN: [id number] Date of Birth: 1980/09/22 Referring Provider:  Mackie Pai, PA-C  Encounter Date: 11/29/2014      PT End of Session - 11/29/14 0902    Visit Number 5   Number of Visits 8   Date for PT Re-Evaluation 12/13/14   PT Start Time 0853   PT Stop Time 0948   PT Time Calculation (min) 55 min   Activity Tolerance Patient tolerated treatment well   Behavior During Therapy [facility] for tasks assessed/performed      Past Medical History  Diagnosis Date  . Amenorrhea 2000    Unknown cause  . Varicose veins     Past Surgical History  Procedure Laterality Date  . Tonsillectomy and adenoidectomy      There were no vitals filed for this visit.  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   Currently in Pain? Yes   Pain Score 3    Pain Location Scapula  & superior shoulder   Pain Orientation Left;Medial                 [facility] Adult PT Treatment/Exercise - 11/29/14 0853    Exercises   Exercises Neck;Shoulder   Neck Exercises: Machines for Strengthening   UBE (Upper Arm Bike) lvl 0 fwd/back x60" each   Neck Exercises: Supine   Neck Retraction 3 secs;10 reps   Neck Retraction Limitations on 1/2 foam roll   Shoulder Exercises: Supine   Protraction Both;10 reps   Protraction Limitations Cane chest press, lying of 1/2 foam roll   Horizontal ABduction Both;10 reps;Theraband   Theraband Level (Shoulder Horizontal ABduction) Level 2 (Red)   Horizontal ABduction Limitations lying on 1/2 foam roll   External Rotation  Both;10 reps;Theraband   Theraband Level (Shoulder External Rotation) Level 2 (Red)   External Rotation Limitations lying on 1/2 foam roll   Flexion Both;10 reps   Flexion Limitations Cane pullover, lying on 1/2 foam roll   Electrical Stimulation   Electrical Stimulation Location Channel 1 to left rhomboids & levator, Channel 2 to left lateral neck & UT   Electrical Stimulation Action Pre-mod   Electrical Stimulation Parameters Pre-mod - variable beat freq 10-20 Hz, intensity to patient tolerance x 15 min   Electrical Stimulation Goals Pain   Manual Therapy   Manual Therapy Soft tissue mobilization;Passive ROM   Soft tissue mobilization Gentle STM to bilateral cervical paraspinals, Bil UT, Lt levator, scalenes, proximal rhomboids   Passive ROM Gentle PROM into cervical sidebending and rotation to pt tolerance.    Neck Exercises: Stretches   Upper Trapezius Stretch 2 reps;20 seconds   Upper Trapezius Stretch Limitations manual   Levator Stretch 2 reps;20 seconds   Levator Stretch Limitations manual   Chest Stretch --  2 min   Chest Stretch Limitations 1/2 foam roll   Other Neck Stretches SCM 2x20" manual                   PT Long Term Goals - 11/29/14 0950    PT LONG  TERM GOAL #1   Title Independent with HEP (12/13/14)   Status On-going   PT LONG TERM GOAL #2   Title Patient will report worst pain in neck and left shoulder no greater than 4/10 (12/13/14)   Status On-going   PT LONG TERM GOAL #3   Title Patient will demonstrate left shoulder ROM WFL without increased pain (12/13/14)   Status On-going   PT LONG TERM GOAL #4   Title Patient will report functional use of left UE to perform household ADL's and job tasks without pain interference (12/13/14)   Status On-going               Plan - 11/29/14 0950    Clinical Impression Statement Patient continues to present to therapy with very guarded posture with forward head, elevated shoulders and left arm held  in flexion at elbow, tight against body. Encouraged relaxation and release of tension. Positional stretch over 1/2 foam roll added to prior to and during supine exercises. Continued emphasis on manual therapy to release muscle tension and reduce apin.   PT Next Visit Plan Postural training, Neck/shoulder exercises, modalities PRN, taping for UT/levator   Consulted and Agree with Plan of Care Patient        Problem List Patient Active Problem List   Diagnosis Date Noted  . Black-out (not amnesia) 06/15/2014    Percival Spanish, PT, MPT 11/29/2014, 12:15 PM  [facility] [address]  [place], [place], [address] Phone: [phone]   Fax:  [phone]

## 2014-12-02 ENCOUNTER — Ambulatory Visit: Payer: Managed Care, Other (non HMO) | Admitting: Rehabilitation

## 2014-12-02 DIAGNOSIS — M542 Cervicalgia: Secondary | ICD-10-CM | POA: Diagnosis not present

## 2014-12-02 DIAGNOSIS — M25512 Pain in left shoulder: Secondary | ICD-10-CM

## 2014-12-02 NOTE — Therapy (Signed)
[facility] [address]  [place], [place], [address] Phone: [phone]   Fax:  [phone]  Physical Therapy Treatment  Patient Details  Name: Melissa Spears MRN: [id number] Date of Birth: [birth year] No Data Recorded  Encounter Date: 12/02/2014      PT End of Session - 12/02/14 1702    Visit Number 6   Number of Visits 8   Date for PT Re-Evaluation 12/13/14   PT Start Time 1701   PT Stop Time 1756   PT Time Calculation (min) 55 min   Activity Tolerance Patient tolerated treatment well   Behavior During Therapy [facility] for tasks assessed/performed      Past Medical History  Diagnosis Date  . Amenorrhea 2000    Unknown cause  . Varicose veins     Past Surgical History  Procedure Laterality Date  . Tonsillectomy and adenoidectomy      There were no vitals filed for this visit.  Visit Diagnosis:  Neck pain on left side  Pain in joint, shoulder region, left      Subjective Assessment - 12/02/14 1702    Subjective Reports was feeling fine until she had a wave of pain a few minutes ago and is sore now. Feels like she is doing better. 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 States she keeps her arm bent up while walking because she feels a major pulling when its down.    Currently in Pain? No/denies            [facility] PT Assessment - 12/02/14 1704    AROM   AROM Assessment Site Shoulder   Right/Left Shoulder Left   Left Shoulder Flexion 135 Degrees   Left Shoulder ABduction 120 Degrees   Cervical Flexion 55   Cervical Extension 45   Cervical - Right Side Bend 40   Cervical - Left Side Bend 38   Cervical - Right Rotation 65   Cervical - Left Rotation 64                     [facility] Adult PT Treatment/Exercise - 12/02/14 1708    Exercises   Exercises Neck;Shoulder   Neck Exercises: Machines for Strengthening   UBE (Upper Arm Bike) lvl 0 fwd/back x90" each   Neck  Exercises: Supine   Neck Retraction 3 secs;10 reps   Neck Retraction Limitations on 1/2 foam roll   Shoulder Exercises: Supine   Protraction Both;10 reps   Protraction Limitations Chest press with 2#, lying on 1/2 foam roll    Horizontal ABduction Both;10 reps;Theraband  12 reps   Theraband Level (Shoulder Horizontal ABduction) Level 2 (Red)   Horizontal ABduction Limitations lying on 1/2 foam roll   External Rotation Both;10 reps;Theraband  12 reps   Theraband Level (Shoulder External Rotation) Level 2 (Red)   External Rotation Limitations lying on 1/2 foam roll   Flexion Both;10 reps   Flexion Limitations 2# pullover, lying on 1/2 foam roll   Other Supine Exercises Circles at 90 degrees flexion CW/CCW x10, bilateral   Shoulder Exercises: Stretch   Other Shoulder Stretches Rt side-lying Lt open book/horizontal abduction 10x2"   Electrical Stimulation   Electrical Stimulation Location Channel 1 to left rhomboids & levator, Channel 2 to left lateral neck & UT   Electrical Stimulation Action Pre-mod   Electrical Stimulation Parameters Pre-mod variable beat freq 10-20 Hz, intensity to pt tolerance x15 minutes  Electrical Stimulation Goals Pain   Manual Therapy   Manual Therapy Soft tissue mobilization;Passive ROM   Soft tissue mobilization Gentle STM to bilateral cervical paraspinals, UT, levator, scalenes, proximal rhomboids   Passive ROM Gentle PROM into cervical sidebending and rotation to pt tolerance.    Neck Exercises: Stretches   Upper Trapezius Stretch 2 reps;20 seconds   Upper Trapezius Stretch Limitations manual   Levator Stretch 2 reps;20 seconds   Levator Stretch Limitations manual   Chest Stretch --  2 minutes   Chest Stretch Limitations 1/2 foam roll   Other Neck Stretches SCM 2x20" manual                     PT Long Term Goals - 11/29/14 0950    PT LONG TERM GOAL #1   Title Independent with HEP (12/13/14)   Status On-going   PT LONG TERM GOAL #2    Title Patient will report worst pain in neck and left shoulder no greater than 4/10 (12/13/14)   Status On-going   PT LONG TERM GOAL #3   Title Patient will demonstrate left shoulder ROM WFL without increased pain (12/13/14)   Status On-going   PT LONG TERM GOAL #4   Title Patient will report functional use of left UE to perform household ADL's and job tasks without pain interference (12/13/14)   Status On-going               Plan - 12/02/14 1748    Clinical Impression Statement Performed foam roll exercises again with good progression by pt. Increase reps and switched from cane to 2# weight. Also attempted supine shoulder circles to help with stability. Excellent progress made with AROM since beginning therapy.    PT Next Visit Plan Postural training, Neck/shoulder exercises, modalities PRN, taping for UT/levator   Consulted and Agree with Plan of Care Patient        Problem List Patient Active Problem List   Diagnosis Date Noted  . Black-out (not amnesia) 06/15/2014    Barbette Hair, PTA 12/02/2014, 5:50 PM  [facility] [address]  [place], [place], [address] Phone: [phone]   Fax:  [phone]  Name: Melissa Spears MRN: [id number] Date of Birth: 07-26-1980

## 2014-12-06 ENCOUNTER — Ambulatory Visit: Payer: Managed Care, Other (non HMO) | Admitting: Physical Therapy

## 2014-12-06 DIAGNOSIS — M542 Cervicalgia: Secondary | ICD-10-CM | POA: Diagnosis not present

## 2014-12-06 DIAGNOSIS — M25512 Pain in left shoulder: Secondary | ICD-10-CM

## 2014-12-06 NOTE — Therapy (Signed)
[facility] [address]  [place], [place], [address] Phone: [phone]   Fax:  [phone]  Physical Therapy Treatment  Patient Details  Name: Melissa Spears MRN: [id number] Date of Birth: Oct 28, 1980 Referring Provider: Mackie Pai, PA-C  Encounter Date: 12/06/2014      PT End of Session - 12/06/14 1012    Visit Number 7   Number of Visits 8   Date for PT Re-Evaluation 12/13/14   PT Start Time 0935  Pt arrived late   PT Stop Time 1026   PT Time Calculation (min) 51 min   Activity Tolerance Patient tolerated treatment well   Behavior During Therapy [facility] for tasks assessed/performed      Past Medical History  Diagnosis Date  . Amenorrhea 2000    Unknown cause  . Varicose veins     Past Surgical History  Procedure Laterality Date  . Tonsillectomy and adenoidectomy      There were no vitals filed for this visit.  Visit Diagnosis:  Neck pain on left side  Pain in joint, shoulder region, left      Subjective Assessment - 12/06/14 0937    Subjective Patient reporting increased pain deep in superior/posterior shoulder and up lateral neck on left side.    Currently in Pain? Yes   Pain Score 6             [facility] PT Assessment - 12/06/14 0935    Assessment   Referring Provider Saguier, Iris Pert                 [facility] Adult PT Treatment/Exercise - 12/06/14 0935    Exercises   Exercises Neck;Shoulder   Neck Exercises: Machines for Strengthening   UBE (Upper Arm Bike) lvl 1 fwd/back x90" each   Shoulder Exercises: Stretch   Other Shoulder Stretches Rt side-lying Lt open book/horizontal abduction 10x5"   Electrical Stimulation   Electrical Stimulation Location Left lateral neck, UT & levator   Electrical Stimulation Action IFC   Electrical Stimulation Parameters 40% scan, 80-150 Hz, intensity to patient tolerance x 12 minutes   Electrical Stimulation Goals Pain   Manual  Therapy   Manual Therapy Soft tissue mobilization;Joint mobilization;Passive ROM   Joint Mobilization Left 1st rib mob in supine   Soft tissue mobilization STM/DTM to bilateral cervical paraspinals, UT, levator, scalenes, proximal rhomboids   Passive ROM Gentle PROM into cervical sidebending and rotation to pt tolerance.    Neck Exercises: Stretches   Upper Trapezius Stretch 2 reps;20 seconds   Upper Trapezius Stretch Limitations manual   Levator Stretch 2 reps;20 seconds   Levator Stretch Limitations manual   Other Neck Stretches SCM 2x20" manual   Other Neck Stretches Anterior/middle/posterior scalenes x20" manual                  PT Long Term Goals - 12/06/14 1019    PT LONG TERM GOAL #1   Title Independent with HEP (12/13/14)   Status On-going   PT LONG TERM GOAL #2   Title Patient will report worst pain in neck and left shoulder no greater than 4/10 (12/13/14)   Status On-going   PT LONG TERM GOAL #3   Title Patient will demonstrate left shoulder ROM WFL without increased pain (12/13/14)   Status On-going   PT LONG TERM GOAL #4   Title Patient will report functional use of left UE to perform household ADL's and job tasks without pain  interference (12/13/14)   Status On-going               Plan - 12/06/14 1025    Clinical Impression Statement Patient demonstrating cervical ROM WNL without pain but continues to report pain deep in left superior/posterior shoulder in vicinity of 1st rib with pain up into side of neck to just behind ear. Focused on manual therapy to identify and release source of pain with patient tolerating increased DTM mobilization today although only partial relief of pain atainable today.   PT Next Visit Plan Recert vs D/C pending pain and goal status   Consulted and Agree with Plan of Care Patient        Problem List Patient Active Problem List   Diagnosis Date Noted  . Black-out (not amnesia) 06/15/2014    Percival Spanish, PT,  MPT 12/06/2014, 10:36 AM  [facility] [address]  [place], [place], [address] Phone: [phone]   Fax:  [phone]  Name: Melissa Spears MRN: [id number] Date of Birth: 11-26-1980

## 2014-12-09 ENCOUNTER — Ambulatory Visit: Payer: Managed Care, Other (non HMO) | Admitting: Physical Therapy

## 2014-12-09 DIAGNOSIS — M542 Cervicalgia: Secondary | ICD-10-CM | POA: Diagnosis not present

## 2014-12-09 DIAGNOSIS — M25512 Pain in left shoulder: Secondary | ICD-10-CM

## 2014-12-09 NOTE — Therapy (Addendum)
[facility] [address]  [place], [place], [address] Phone: [phone]   Fax:  [phone]  Physical Therapy Treatment  Patient Details  Name: Melissa Spears MRN: [id number] Date of Birth: 11-15-1980 Referring Provider: Mackie Pai, PA-C  Encounter Date: 12/09/2014      PT End of Session - 12/09/14 1739    Visit Number 8   Number of Visits 8   PT Start Time 6144   PT Stop Time 3154   PT Time Calculation (min) 61 min   Activity Tolerance Patient limited by pain;Patient tolerated treatment well   Behavior During Therapy [facility] for tasks assessed/performed      Past Medical History  Diagnosis Date  . Amenorrhea 2000    Unknown cause  . Varicose veins     Past Surgical History  Procedure Laterality Date  . Tonsillectomy and adenoidectomy      There were no vitals filed for this visit.  Visit Diagnosis:  Neck pain on left side  Pain in joint, shoulder region, left      Subjective Assessment - 12/09/14 1713    Currently in Pain? Yes   Pain Score 8    Pain Location Neck  & shoulder   Pain Orientation Left   Pain Descriptors / Indicators Sharp            [facility] PT Assessment - 12/09/14 1706    Observation/Other Assessments   Focus on Therapeutic Outcomes (FOTO)  28% (72% limitation)   ROM / Strength   AROM / PROM / Strength AROM   AROM   AROM Assessment Site Shoulder;Cervical   Right/Left Shoulder Left   Left Shoulder Flexion 166 Degrees   Left Shoulder ABduction 135 Degrees   Left Shoulder Internal Rotation --  [facility]   Left Shoulder External Rotation --  [facility]   Cervical Flexion 68   Cervical Extension 45   Cervical - Right Side Bend 44   Cervical - Left Side Bend 43   Cervical - Right Rotation 78   Cervical - Left Rotation 70                     [facility] Adult PT Treatment/Exercise - 12/09/14 0001    Exercises   Exercises Neck;Shoulder   Neck Exercises: Machines for  Strengthening   UBE (Upper Arm Bike) lvl 1 fwd/back x90" each   Shoulder Exercises: Stretch   Other Shoulder Stretches Rt side-lying Lt open book/horizontal abduction 10x5"   Modalities   Modalities Electrical Stimulation   Electrical Stimulation   Electrical Stimulation Location Left lateral neck, UT & levator   Electrical Stimulation Action IFC   Electrical Stimulation Parameters 40% scan, 80-150 Hz, intensity to patient tolerance x 15 minutes   Electrical Stimulation Goals Pain   Manual Therapy   Manual Therapy Soft tissue mobilization;Joint mobilization;Passive ROM   Joint Mobilization Left 1st rib mob in supine   Soft tissue mobilization STM/DTM to bilateral cervical paraspinals, UT, levator, scalenes, proximal rhomboids   Passive ROM Gentle PROM into cervical sidebending and rotation to pt tolerance.    Neck Exercises: Stretches   Upper Trapezius Stretch 2 reps;20 seconds   Upper Trapezius Stretch Limitations manual   Levator Stretch 2 reps;20 seconds   Levator Stretch Limitations manual   Other Neck Stretches SCM 2x20" manual   Other Neck Stretches Anterior/middle/posterior scalenes x20" manual  PT Long Term Goals - 12/09/14 1806    PT LONG TERM GOAL #1   Title Independent with HEP (12/13/14)   Status Achieved   PT LONG TERM GOAL #2   Title Patient will report worst pain in neck and left shoulder no greater than 4/10 (12/13/14)   Status On-going   PT LONG TERM GOAL #3   Title Patient will demonstrate left shoulder ROM WFL without increased pain (12/13/14)   Status Partially Met  ROM WFL but still with pain at end range with overhead motions   PT LONG TERM GOAL #4   Title Patient will report functional use of left UE to perform household ADL's and job tasks without pain interference (12/13/14)   Status On-going               Plan - 12/09/14 1748    Clinical Impression Statement Patient demonstrating restoration of full cervical  ROM and left shoulder ROM WNL except some restriction in abduction ROM against gravity in sitting (full ROM available in supine) as a result of PT, however she continues to report significant pain in left lateral neck and superior shoulder which does not seem to respond to manual therapy or exercises and only minimal relief from estim. Pain continues to limit functional use of left arm during daily home and job tasks. Patient requesting to take a break from therapy and wants to follow up with MD for further evaluation, therefore will place patient on hold x 30 days.   PT Next Visit Plan Hold x 30 days with patient to follow up with MD   Consulted and Agree with Plan of Care Patient        Problem List Patient Active Problem List   Diagnosis Date Noted  . Black-out (not amnesia) 06/15/2014    Percival Spanish, PT, MPT 12/09/2014, 6:23 PM  [facility] [address]  [place], [place], [address] Phone: [phone]   Fax:  [phone]  Name: Melissa Spears MRN: [id number] Date of Birth: [birth year]    PHYSICAL THERAPY DISCHARGE SUMMARY  Visits from Start of Care: 8  Current functional level related to goals / functional outcomes:   AS of last PT visit, Patient demonstrated restoration of full cervical ROM and left shoulder ROM WNL except some restriction in abduction ROM against gravity in sitting (full ROM available in supine) as a result of PT, however she continues to report significant pain in left lateral neck and superior shoulder which did not seem to respond to manual therapy or exercises and only minimal relief from estim. Pain continued to limit functional use of left arm during daily home and job tasks. Patient requested to take a break from therapy and wanted to follow up with MD for further evaluation, therefore patient placed on hold x 30 days. No further communication received from patient over the 30 day hold  period, therefore will proceed with discharge from PT.   Remaining deficits:  Continued significant pain in left lateral neck and superior shoulder which did not seem to respond to manual therapy or exercises and only minimal relief from estim. Pain continued to limit functional use of left arm during daily home and job tasks.   Education / Equipment:  HEP   Plan: Patient agrees to discharge.  Patient goals were partially met. Patient is being discharged due to the patient's request.  ?????       Percival Spanish, PT, MPT  01/25/2015, 2:34 PM  [facility] [address]  [place], [place], [address] Phone: [phone]   Fax:  [phone]

## 2015-04-19 ENCOUNTER — Telehealth: Payer: Self-pay | Admitting: Family Medicine

## 2015-04-19 NOTE — Telephone Encounter (Signed)
Patient will get Flu Shot this week at her job

## 2015-04-19 NOTE — Telephone Encounter (Signed)
Updated.      KP 

## 2015-04-19 NOTE — Telephone Encounter (Signed)
LM for pt to call and schedule flu shot or update records & due for cpe after 06/15/15

## 2018-10-28 ENCOUNTER — Other Ambulatory Visit: Payer: Self-pay | Admitting: Endocrinology

## 2018-10-28 DIAGNOSIS — E221 Hyperprolactinemia: Secondary | ICD-10-CM

## 2018-10-28 DIAGNOSIS — D352 Benign neoplasm of pituitary gland: Secondary | ICD-10-CM

## 2019-02-26 LAB — OB RESULTS CONSOLE HIV ANTIBODY (ROUTINE TESTING): HIV: NONREACTIVE

## 2019-02-26 LAB — OB RESULTS CONSOLE ANTIBODY SCREEN: Antibody Screen: NEGATIVE

## 2019-02-26 LAB — OB RESULTS CONSOLE ABO/RH: RH Type: POSITIVE

## 2019-02-26 LAB — OB RESULTS CONSOLE HEPATITIS B SURFACE ANTIGEN: Hepatitis B Surface Ag: NEGATIVE

## 2019-02-26 LAB — OB RESULTS CONSOLE GC/CHLAMYDIA
Chlamydia: NEGATIVE
Gonorrhea: NEGATIVE

## 2019-02-26 LAB — OB RESULTS CONSOLE RUBELLA ANTIBODY, IGM: Rubella: IMMUNE

## 2019-02-26 LAB — OB RESULTS CONSOLE RPR: RPR: NONREACTIVE

## 2019-03-31 ENCOUNTER — Other Ambulatory Visit: Payer: Self-pay

## 2019-04-02 ENCOUNTER — Encounter (HOSPITAL_COMMUNITY): Payer: Self-pay

## 2019-04-02 ENCOUNTER — Other Ambulatory Visit (HOSPITAL_COMMUNITY): Payer: Self-pay | Admitting: Obstetrics and Gynecology

## 2019-04-02 DIAGNOSIS — Z363 Encounter for antenatal screening for malformations: Secondary | ICD-10-CM

## 2019-04-02 DIAGNOSIS — O280 Abnormal hematological finding on antenatal screening of mother: Secondary | ICD-10-CM

## 2019-04-02 DIAGNOSIS — Z3A22 22 weeks gestation of pregnancy: Secondary | ICD-10-CM

## 2019-04-08 ENCOUNTER — Encounter (HOSPITAL_COMMUNITY): Payer: Self-pay | Admitting: *Deleted

## 2019-04-13 ENCOUNTER — Other Ambulatory Visit (HOSPITAL_COMMUNITY): Payer: Self-pay | Admitting: *Deleted

## 2019-04-13 ENCOUNTER — Other Ambulatory Visit: Payer: Self-pay

## 2019-04-13 ENCOUNTER — Encounter (HOSPITAL_COMMUNITY): Payer: Self-pay

## 2019-04-13 ENCOUNTER — Ambulatory Visit (HOSPITAL_BASED_OUTPATIENT_CLINIC_OR_DEPARTMENT_OTHER): Payer: Managed Care, Other (non HMO) | Admitting: Genetic Counselor

## 2019-04-13 ENCOUNTER — Ambulatory Visit (HOSPITAL_COMMUNITY): Payer: Managed Care, Other (non HMO) | Admitting: *Deleted

## 2019-04-13 ENCOUNTER — Ambulatory Visit (HOSPITAL_COMMUNITY)
Admission: RE | Admit: 2019-04-13 | Discharge: 2019-04-13 | Disposition: A | Discharge status: Home / Self Care | Payer: Managed Care, Other (non HMO) | Source: Ambulatory Visit | Attending: Obstetrics and Gynecology | Admitting: Obstetrics and Gynecology

## 2019-04-13 ENCOUNTER — Other Ambulatory Visit (HOSPITAL_COMMUNITY): Payer: Self-pay | Admitting: Obstetrics and Gynecology

## 2019-04-13 VITALS — BP 134/88 | HR 98 | Temp 98.0°F | Ht <= 58 in | Wt 207.0 lb

## 2019-04-13 DIAGNOSIS — Z3A22 22 weeks gestation of pregnancy: Secondary | ICD-10-CM | POA: Insufficient documentation

## 2019-04-13 DIAGNOSIS — Z36 Encounter for antenatal screening for chromosomal anomalies: Secondary | ICD-10-CM

## 2019-04-13 DIAGNOSIS — O99212 Obesity complicating pregnancy, second trimester: Secondary | ICD-10-CM | POA: Insufficient documentation

## 2019-04-13 DIAGNOSIS — O09512 Supervision of elderly primigravida, second trimester: Secondary | ICD-10-CM | POA: Diagnosis present

## 2019-04-13 DIAGNOSIS — O285 Abnormal chromosomal and genetic finding on antenatal screening of mother: Secondary | ICD-10-CM

## 2019-04-13 DIAGNOSIS — Q913 Trisomy 18, unspecified: Secondary | ICD-10-CM | POA: Diagnosis not present

## 2019-04-13 DIAGNOSIS — O289 Unspecified abnormal findings on antenatal screening of mother: Secondary | ICD-10-CM

## 2019-04-13 DIAGNOSIS — O280 Abnormal hematological finding on antenatal screening of mother: Secondary | ICD-10-CM | POA: Insufficient documentation

## 2019-04-13 DIAGNOSIS — Z363 Encounter for antenatal screening for malformations: Secondary | ICD-10-CM

## 2019-04-13 DIAGNOSIS — Z315 Encounter for genetic counseling: Secondary | ICD-10-CM

## 2019-04-13 DIAGNOSIS — O09522 Supervision of elderly multigravida, second trimester: Secondary | ICD-10-CM | POA: Insufficient documentation

## 2019-04-13 DIAGNOSIS — E669 Obesity, unspecified: Secondary | ICD-10-CM | POA: Diagnosis not present

## 2019-04-13 DIAGNOSIS — O0990 Supervision of high risk pregnancy, unspecified, unspecified trimester: Secondary | ICD-10-CM

## 2019-04-13 NOTE — Consult Note (Signed)
MFM Note   This patient was seen for a detailed fetal anatomy scan due to advanced maternal age, maternal obesity, and as her cell free DNA test indicated an increased risk for trisomy 24.  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  She had been treated with cabergoline and bromocriptine outside of pregnancy.  She reports that she is followed by an endocrinologist for the management of her prolactinoma and is undergoing frequent visual field testing.  She denies any other significant past medical history and denies any problems in her current pregnancy.    Her cell free DNA test indicated a low risk for Down syndrome and trisomy 13.  A female fetus is predicted.  She was informed that the fetal growth and amniotic fluid level were appropriate for her gestational age.   There were no obvious fetal anomalies noted on today's ultrasound exam.  However, today's exam was limited by the maternal body habitus and the fetal position.  The patient was informed that anomalies may be missed due to technical limitations. If the fetus is in a suboptimal position or maternal habitus is increased, visualization of the fetus in the maternal uterus may be impaired.  The patient has received extensive counseling from both myself and the genetic counselor regarding the risks versus benefits of the amniocentesis procedure.  She has stated that she wants the procedure done today for definitive diagnosis.   After informed consent was obtained, a timeout was performed verifying the patient's identity and the indications for the procedure.  The patient was then prepped and draped in the usual sterile fashion.  An uncomplicated genetic amniocentesis was performed today obtaining 30 cc of straw-colored amniotic fluid which was then sent off for amniotic fluid AFP, FISH, and chromosome analysis.  The patient was advised that our genetic counselor will notify her regarding the results of the amniocentesis.  Post  amniocentesis instructions were discussed.  As the patient's blood type is Rh positive, a dose of RhoGam was not given following the procedure.  A follow-up exam was scheduled in 4 weeks to complete the views of the fetal anatomy.

## 2019-04-13 NOTE — Progress Notes (Signed)
04/13/2019  Garnet Sierras April 23, 1980 MRN: [id number] DOV: 04/13/2019  Ms. Helser presented to the [facility] for Maternal Fetal Care for a genetics consultation regarding noninvasive prenatal screening (NIPS) results that were high risk for trisomy 44. Ms. Kucinski came to her appointment alone due to COVID-19 visitor restrictions.   Indication for genetic counseling - NIPS high risk for trisomy 61  Prenatal history  Ms. Roederer is a G80P0, 39 y.o. female. Her current pregnancy has completed [redacted]w[redacted]d (Estimated Date of Delivery: 08/12/19).  Ms. Stanwood denied exposure to environmental toxins or chemical agents. She denied the use of alcohol, tobacco or street drugs. She reported taking Tylenol. She denied significant viral illnesses, fevers, and bleeding during the course of her pregnancy. Her history is significant for a prolactinoma. Her medical and surgical histories are otherwise noncontributory.  Family History  A three generation pedigree was not drafted. However, per Ms. Dieckman's intake form, she and her partner's family histories are noncontributory for birth defects, intellectual disability, recurrent pregnancy loss, and known genetic conditions.    The patient's ethnicity is African American. The father of the pregnancy's ethnicity is Caucasian. Ashkenazi Jewish ancestry and consanguinity were denied.   Discussion  Ms. Maltese was referred for genetic counseling as results from noninvasive prenatal screening (NIPS) came back positive for an increased risk of trisomy 1 in the current pregnancy. Trisomy 18, AKA Edwards syndrome, is a condition associated with severe intellectual disability and physical abnormalities in many part of the body. Fetuses affected with trisomy 18 may present with central nervous systemabnormalities, cardiacdefects, gastrointestinal anomalies, growth restriction, and abnormalities of the feet and hands including club feet, clenched hands, and overlapping fingers.  Due to the severity of the condition, approximately 30-60% of pregnancies affected with trisomy 18 result in miscarriage, fetal demise, or stillbirth from 13 weeks' gestation until term. Of liveborn babies, approximately 10% survive beyond the first year of life. Those who survive have severe intellectual disabilities and often have various other health problems, such as seizures, open neural tube defects, craniofacial anomalies, vision abnormalities, hearing loss, congenital heart defects, musculoskeletal abnormalities, gastrointestinal problems, renal anomalies, and delayed growth.  Trisomy 18typicallyresults from having three copies of chromosome 18. Thismost oftenoccursdue to arandom error in chromosomal divisionduring the formation of reproductive cells in a process called nondisjunction. The risk of several fetal aneuploidies including trisomy 62 increases with maternal age as a result of increased frequency in nondisjunctionerrors.Rarely, trisomy 18 may result from a translocation involving chromosome 57 and another chromosome.   We reviewed that NIPS analyzes cell free DNA originating from the placenta that is found in the maternal blood circulation during pregnancy. This test can provide information regarding the presence or absence of extra fetal DNA for chromosomes 13, 18 and 21. The reported detection rate is greater than 99% for trisomy 21, greater than 99% for trisomy 18, greater than 91% for trisomy 13, and greater than 96% for sex chromosome aneuploidies. However, it cannot be considered diagnostic for chromosome conditions. Positive predictive value (PPV) is the probability that a pregnancy with a positive test result is truly affected. The PPV reported by the NIPS laboratory for trisomy 18 in the current pregnancy was estimated to be 85%. The PPV calculator offered by the [place] estimated PPV for trisomy 18 in the  current pregnancy to be 51%. Thus, there is a 51-85% chance that this could be a true positive result based on Ms. Petrosky's NIPS result  alone. This wide range demonstrates that NIPS is an imperfect screen.    Ms. Huard was counseled that there are several possible explanations for her high risk NIPS result. Firstly, the fetus could truly be affected by trisomy 74. Secondly, the fetus could be mosaic for trisomy 68. Mosaicism occurs when an individual has two or more genetically different sets of cells in their body. Individuals who are mosaic for trisomy 18 have some cells in the body with trisomy 67 and may have other cells that are chromosomally normal. We discussed that it would not be possible to tell which features an individual with mosaic trisomy 18 may have, as it is impossible to assess which specific cells and tissues in the body have trisomy 18. Thirdly, the placenta could have trisomy 18 while the fetus could be unaffected. This is a phenomenon known as confined placental mosaicism (CPM). Lastly, this could be a false positive result that resulted due to normal variation.  A complete ultrasound was performed today prior to our visit. The ultrasound report will be sent under separate cover. There were no visualized fetal anomalies or markers suggestive of aneuploidy. Ms. Racine was counseled that approximately 90% of fetuses with trisomy 56 will have one or more finding on anatomy ultrasound, including congenital heart defects, congenital diaphragmatic hernia, omphalocele, imperforated anus, microcephaly, microphthalmia, renal anomalies, open neural tube defects, limb abnormalities such as polydactyly, clenched hands, and rocker bottom feet, and fetal growth restriction. The fact that no ultrasound anomalies were identified today is encouraging; however, 10% of fetuses with trisomy 18 may not demonstrate any signs of the condition on ultrasound.  Ms. Tomasko was counseled regarding diagnostic testing  via amniocentesis. We discussed the technical aspects of the procedure and quoted up to a 1 in 500 (0.2%) risk for spontaneous pregnancy loss or other adverse pregnancy outcomes as a result of amniocentesis. Cultured cells from an amniocentesis sample allow for the visualization of a fetal karyotype, which can detect >99% of chromosomal aberrations. Chromosomal microarray can also be performed to identify smaller deletions or duplications of fetal chromosomal material. Ms. Osorno was informed that diagnostic testing would be the only way to definitively determine if the fetus has trisomy 18 prenatally. She was also made aware that she can continue with standard ultrasounds to monitor for fetal anomalies and that she has the option to wait to pursue genetic testing postnatally if desired. After careful consideration, Ms. Uresti opted to undergo amniocentesis.   We discussed that if the fetus is confirmed to havetrisomy 32, there areseveraldifferent waysan individual may chooseto proceed with the pregnancy. If the pregnancy is continued, it would likely be followed via ultrasounds and a fetal echocardiogram (detailed ultrasound of the fetal heart). Some families opt to pursue perinatal palliative care through a perinatal hospice program such as [facility].This program provides supportive services for parents who are expecting a baby with a life-limitingcondition. The [facility] team helps families make birth care plans that are in line with their wishes and values, providesconsultations about what supportive comfort measures can be planned for their baby, offerssupportive counseling for parents, and assistswith the creation of memories and keepsakes at the time of birthif desired. Other families may pursue to pursue extensive interventions or treatments for their child. Additionally, some individuals choose to pursue adoption as an option. Finally, it is an option to end the pregnancy until 20-22 weeks'  gestation at some family planning clinics in the state of [place].Termination is still an option beyond 56  weeks'gestation in other states. Termination of pregnancy may occur through induction of labor or a dilation & evacuation (D&E) procedure. Ms.Deloria informed me that she and her husband had previously decided that termination of pregnancy would be the right decision for them if a diagnosis of trisomy 65 is confirmed in the fetus. I informed Ms. Dorow that the legal limit for pregnancy termination in [place] is 25 weeks and 6 days when a life-limiting diagnosis such as trisomy 80 has been made. The [facility] at [place] is the closest clinic geographically that would perform termination of pregnancy at this gestational age. Ms. Gatewood confirmed that she would be willing to travel to [place] should a diagnosis of trisomy 18 be confirmed in the fetus.   Ms. Mcbreairty underwent amniocentesis today. We ordered FISH for chromosomal aneuploidies, karyotype with reflex to chromosomal microarray, and AF-AFP to assess for open neural tube defects. Results from [facility] should be returned around 48-72 hours after the date of the procedure. While FISH can provide a preliminary answer as to whether or not the fetus may be affected by full trisomy 18, it cannot detect translocations or other chromosomal rearrangements whereas karyotype and chromosomal microarray can. Confirmation by karyotype/chromosomal microarray is recommended before using testing results to inform pregnancy management. Results from karyotype/chromosomal microarray will take 2-3 weeks to be returned. I will call Ms. Hoos with the results when they become available. I will also assist in coordinating an appointment with the [facility] should results be positive for fetal trisomy 18.  I counseled Ms. Fulcher regarding the above risks and available options. The approximate face-to-face time with the genetic counselor  was 40 minutes.  In summary:  Discussed NIPS result and options for follow-up testing  NIPS high risk for trisomy 18  Opted to undergo amniocentesis. We will follow results  Reviewed results of ultrasound  No fetal anomalies or markers seen  Ultrasound anomalies identified in ~90% of fetuses with trisomy 18  Reduction in risk for fetal aneuploidy  Reviewed pregnancy management options if diagnosis of trisomy 85 is confirmed  Termination of pregnancy via induction or dilation & evacuation in [place] or another state vs. continuing the pregnancy  Perinatal palliative care and pursuing more extensive interventions are both possibilities if continuing the pregnancy  Opted to pursue termination if trisomy 48 diagnosis is confirmed. We will coordinate this with [facility] if amniocentesis positive for trisomy [place], [place]

## 2019-04-15 ENCOUNTER — Telehealth (HOSPITAL_COMMUNITY): Payer: Self-pay | Admitting: Genetic Counselor

## 2019-04-15 NOTE — Telephone Encounter (Signed)
I called [name] to discuss her negative FISH results from amniocentesis. No numerical abnormalities were identified for chromosomes X, Y, 13, 18, or 21, reducing the likelihood of full trisomy 64, full trisomy 60, full trisomy 14, and sex chromosome abnormalities in the fetus. I reviewed that while FISH results are not considered diagnostic, this result is reassuring that the fetus likely does not have full trisomy 31, full trisomy 75, or triploidy. Fetal karyotype on the amniocentesis sample is still being completed. While it is expected that karyotype will confirm the results from [facility], there is the possibility of something else being identified on karyotype, such as a chromosomal rearrangement, a large deletion or duplication of chromosomal material, or an abnormality involving a chromosome other than the ones evaluated via [place]. Additionally, if karyotype results are normal, chromosomal microarray analysis will be performed to assess for deletions or duplications of chromosomal material that fall below the size threshold for karyotype. Results from karyotype will likely take another week to two weeks to be returned. I will call [name] when results become available. [name] confirmed that she had no further questions at this time.    Buelah Manis, MS Genetic Counselor

## 2019-04-24 ENCOUNTER — Telehealth (HOSPITAL_COMMUNITY): Payer: Self-pay | Admitting: Genetic Counselor

## 2019-04-24 LAB — MCC TRACKING

## 2019-04-27 NOTE — Telephone Encounter (Signed)
I called Ms. Eplin to discuss her negative karyotype results from amniocentesis. Results from fetal karyotype revealed a normal female complement (46,XY). Thissignificantly reducesthe possibility of a chromosomal aneuploidy such as Down syndrome, trisomy 43, or trisomy 18in this fetus,asamniocentesisis able to diagnose chromosome aneuploidies with a sensitivity of>99%. These results confirm results from [facility] that we discussed on 2/24. I reminded Ms. Ureste that since karyotype was normal, the laboratory will reflex to a chromosomal microarray to identify any smaller missing or extra pieces of chromosomal material that would fall below the detection range for karyotype. If chromosomal microarray is normal, it is likely that Ms. 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 Results from chromosomal microarray will take another week or two to be returned. I will call Ms. Wenz when results become available. She confirmed that she had no further questions at this time.  Buelah Manis, MS, [facility] Genetic Counselor

## 2019-05-05 LAB — CHROMOSOME MICROARRAY REFLEX, AMN FLD

## 2019-05-05 LAB — CHROMOSOME ANALYSIS W REFLEX TO SNP, AMNIOTIC
Cells Analyzed: 15
Cells Counted: 15
Cells Karyotyped: 2
Colonies: 15
GTG Band Resolution Achieved: 450

## 2019-05-05 LAB — INSIGHT AMNIO FISH XY,13,18,21
Cells Analyzed: 50
Cells Counted: 50

## 2019-05-05 LAB — MATERNAL CELL CONTAMINATION

## 2019-05-05 LAB — AFP, AMNIOTIC FLUID
AFP, Amniotic Fluid (mcg/ml): 4.7 ug/mL
Gestational Age(Wks): 22
MOM, Amniotic Fluid: 1.03

## 2019-05-06 ENCOUNTER — Other Ambulatory Visit (HOSPITAL_COMMUNITY): Payer: Self-pay | Admitting: Obstetrics

## 2019-05-06 ENCOUNTER — Telehealth (HOSPITAL_COMMUNITY): Payer: Self-pay | Admitting: Genetic Counselor

## 2019-05-06 NOTE — Telephone Encounter (Signed)
I called Ms. Foye to discuss her negative chromosomal microarray results from amniocentesis. Microarray analysis revealed a normal female complement with no significant DNA copy number changes or copy neutral regions and no maternal cell contamination. This significantly reduces the possibility of a chromosomal microdeletion or microduplication syndrome in this fetus. While karyotype and chromosomal microarray cannot rule out all genetic syndromes, normal karyotype and chromosomal microarray analyses for the current fetus rule out clinically significant chromosomal abnormalities. This means that Ms. Gallick's noninvasive prenatal screening (NIPS) result that was high-risk for trisomy 18 was a false positive result, either due to normal variation or confined placental mosaicism (see Genetic Counseling note from 2/22). Ms. Girolamo was extremely relieved to hear this result and confirmed that she had no further questions at this time.  Buelah Manis, MS, [facility] Genetic Counselor

## 2019-05-11 ENCOUNTER — Ambulatory Visit (HOSPITAL_COMMUNITY): Payer: Managed Care, Other (non HMO)

## 2019-05-11 ENCOUNTER — Encounter (HOSPITAL_COMMUNITY): Payer: Self-pay

## 2019-07-14 LAB — OB RESULTS CONSOLE GBS: GBS: POSITIVE

## 2019-07-31 ENCOUNTER — Encounter (HOSPITAL_COMMUNITY): Payer: Self-pay | Admitting: *Deleted

## 2019-07-31 ENCOUNTER — Telehealth (HOSPITAL_COMMUNITY): Payer: Self-pay | Admitting: *Deleted

## 2019-07-31 NOTE — Telephone Encounter (Signed)
Preadmission screen  

## 2019-08-03 ENCOUNTER — Encounter (HOSPITAL_COMMUNITY)
Disposition: A | Discharge status: Home / Self Care | Payer: Self-pay | Source: Home / Self Care | Attending: Obstetrics and Gynecology

## 2019-08-03 ENCOUNTER — Encounter (HOSPITAL_COMMUNITY): Payer: Self-pay | Admitting: Anesthesiology

## 2019-08-03 ENCOUNTER — Encounter (HOSPITAL_COMMUNITY): Payer: Self-pay | Admitting: Obstetrics and Gynecology

## 2019-08-03 ENCOUNTER — Other Ambulatory Visit (HOSPITAL_COMMUNITY): Admission: RE | Admit: 2019-08-03 | Payer: Managed Care, Other (non HMO) | Source: Ambulatory Visit

## 2019-08-03 ENCOUNTER — Inpatient Hospital Stay (HOSPITAL_COMMUNITY): Payer: Managed Care, Other (non HMO) | Admitting: Anesthesiology

## 2019-08-03 ENCOUNTER — Inpatient Hospital Stay (HOSPITAL_COMMUNITY)
Admit: 2019-08-03 | Discharge: 2019-08-06 | DRG: 788 | Disposition: A | Discharge status: Home / Self Care | Payer: Managed Care, Other (non HMO) | Attending: Obstetrics and Gynecology | Admitting: Obstetrics and Gynecology

## 2019-08-03 ENCOUNTER — Other Ambulatory Visit: Payer: Self-pay

## 2019-08-03 DIAGNOSIS — O403XX0 Polyhydramnios, third trimester, not applicable or unspecified: Secondary | ICD-10-CM | POA: Diagnosis present

## 2019-08-03 DIAGNOSIS — O4290 Premature rupture of membranes, unspecified as to length of time between rupture and onset of labor, unspecified weeks of gestation: Secondary | ICD-10-CM | POA: Diagnosis present

## 2019-08-03 DIAGNOSIS — O99824 Streptococcus B carrier state complicating childbirth: Secondary | ICD-10-CM | POA: Diagnosis present

## 2019-08-03 DIAGNOSIS — O4292 Full-term premature rupture of membranes, unspecified as to length of time between rupture and onset of labor: Principal | ICD-10-CM | POA: Diagnosis present

## 2019-08-03 DIAGNOSIS — Z20822 Contact with and (suspected) exposure to covid-19: Secondary | ICD-10-CM | POA: Diagnosis present

## 2019-08-03 DIAGNOSIS — Z3A38 38 weeks gestation of pregnancy: Secondary | ICD-10-CM

## 2019-08-03 DIAGNOSIS — O99214 Obesity complicating childbirth: Secondary | ICD-10-CM | POA: Diagnosis present

## 2019-08-03 LAB — CBC
HCT: 37.6 % (ref 36.0–46.0)
HCT: 39.5 % (ref 36.0–46.0)
Hemoglobin: 12.2 g/dL (ref 12.0–15.0)
Hemoglobin: 12.9 g/dL (ref 12.0–15.0)
MCH: 29.8 pg (ref 26.0–34.0)
MCH: 29.9 pg (ref 26.0–34.0)
MCHC: 32.4 g/dL (ref 30.0–36.0)
MCHC: 32.7 g/dL (ref 30.0–36.0)
MCV: 91.7 fL (ref 80.0–100.0)
MCV: 91.4 fL (ref 80.0–100.0)
Platelets: 317 10*3/uL (ref 150–400)
Platelets: 313 10*3/uL (ref 150–400)
RBC: 4.1 MIL/uL (ref 3.87–5.11)
RBC: 4.32 MIL/uL (ref 3.87–5.11)
RDW: 14.1 % (ref 11.5–15.5)
RDW: 14 % (ref 11.5–15.5)
WBC: 13 10*3/uL — ABNORMAL HIGH (ref 4.0–10.5)
WBC: 13.2 10*3/uL — ABNORMAL HIGH (ref 4.0–10.5)
nRBC: 0 % (ref 0.0–0.2)
nRBC: 0 % (ref 0.0–0.2)

## 2019-08-03 LAB — TYPE AND SCREEN
ABO/RH(D): A POS
Antibody Screen: NEGATIVE

## 2019-08-03 LAB — URIC ACID: Uric Acid, Serum: 5.4 mg/dL (ref 2.5–7.1)

## 2019-08-03 LAB — LACTATE DEHYDROGENASE: LDH: 250 U/L — ABNORMAL HIGH (ref 98–192)

## 2019-08-03 LAB — RPR: RPR Ser Ql: NONREACTIVE

## 2019-08-03 LAB — COMPREHENSIVE METABOLIC PANEL
ALT: 29 U/L (ref 0–44)
AST: 32 U/L (ref 15–41)
Albumin: 2.3 g/dL — ABNORMAL LOW (ref 3.5–5.0)
Alkaline Phosphatase: 128 U/L — ABNORMAL HIGH (ref 38–126)
Anion gap: 10 (ref 5–15)
BUN: 14 mg/dL (ref 6–20)
CO2: 21 mmol/L — ABNORMAL LOW (ref 22–32)
Calcium: 8.8 mg/dL — ABNORMAL LOW (ref 8.9–10.3)
Chloride: 107 mmol/L (ref 98–111)
Creatinine, Ser: 0.75 mg/dL (ref 0.44–1.00)
GFR calc Af Amer: >60 mL/min (ref >60)
GFR calc non Af Amer: >60 mL/min (ref >60)
Glucose, Bld: 130 mg/dL — ABNORMAL HIGH (ref 70–99)
Potassium: 4.2 mmol/L (ref 3.5–5.1)
Sodium: 138 mmol/L (ref 135–145)
Total Bilirubin: 0.4 mg/dL (ref 0.3–1.2)
Total Protein: 5.6 g/dL — ABNORMAL LOW (ref 6.5–8.1)

## 2019-08-03 LAB — SARS CORONAVIRUS 2 BY RT PCR (HOSPITAL ORDER, PERFORMED IN ~~LOC~~ HOSPITAL LAB): SARS Coronavirus 2: NEGATIVE

## 2019-08-03 LAB — PROTEIN / CREATININE RATIO, URINE
Creatinine, Urine: 64.27 mg/dL
Protein Creatinine Ratio: 0.28 mg/mg{Cre} — ABNORMAL HIGH (ref 0.00–0.15)
Total Protein, Urine: 18 mg/dL

## 2019-08-03 LAB — ABO/RH: ABO/RH(D): A POS

## 2019-08-03 LAB — POCT FERN TEST: POCT Fern Test: POSITIVE

## 2019-08-03 SURGERY — Surgical Case
Anesthesia: Epidural | Wound class: Clean Contaminated

## 2019-08-03 MED ORDER — WITCH HAZEL-GLYCERIN EX PADS: 1.0000 "application " | MEDICATED_PAD | CUTANEOUS | Status: DC | PRN

## 2019-08-03 MED ORDER — FENTANYL CITRATE (PF) 100 MCG/2ML IJ SOLN
50.0000 ug | INTRAMUSCULAR | Status: DC | PRN
  Administered 2019-08-03: 100 ug via INTRAVENOUS
  Filled 2019-08-03: qty 2

## 2019-08-03 MED ORDER — EPHEDRINE 5 MG/ML INJ: 10.0000 mg | INTRAVENOUS | Status: DC | PRN

## 2019-08-03 MED ORDER — METHYLERGONOVINE MALEATE 0.2 MG PO TABS: 0.2000 mg | ORAL_TABLET | ORAL | Status: DC | PRN

## 2019-08-03 MED ORDER — OXYCODONE HCL 5 MG PO TABS: 5.0000 mg | ORAL_TABLET | Freq: Once | ORAL | Status: DC | PRN

## 2019-08-03 MED ORDER — ONDANSETRON HCL 4 MG/2ML IJ SOLN: 4.0000 mg | Freq: Once | INTRAMUSCULAR | Status: DC | PRN

## 2019-08-03 MED ORDER — SODIUM CHLORIDE 0.9 % IV SOLN
INTRAVENOUS | Status: DC | PRN
Start: 2019-08-03 — End: 2019-08-03

## 2019-08-03 MED ORDER — OXYTOCIN-SODIUM CHLORIDE 30-0.9 UT/500ML-% IV SOLN
1.0000 m[IU]/min | INTRAVENOUS | Status: DC
  Administered 2019-08-03: 2 m[IU]/min via INTRAVENOUS

## 2019-08-03 MED ORDER — LIDOCAINE-EPINEPHRINE (PF) 2 %-1:200000 IJ SOLN
INTRAMUSCULAR | Status: AC
  Filled 2019-08-03: qty 10

## 2019-08-03 MED ORDER — ONDANSETRON HCL 4 MG/2ML IJ SOLN: 4.0000 mg | Freq: Three times a day (TID) | INTRAMUSCULAR | Status: DC | PRN

## 2019-08-03 MED ORDER — SOD CITRATE-CITRIC ACID 500-334 MG/5ML PO SOLN: 30.0000 mL | ORAL | Status: DC

## 2019-08-03 MED ORDER — OXYTOCIN BOLUS FROM INFUSION: 500.0000 mL | Freq: Once | INTRAVENOUS | Status: DC

## 2019-08-03 MED ORDER — NALBUPHINE HCL 10 MG/ML IJ SOLN: 5.0000 mg | Freq: Once | INTRAMUSCULAR | Status: DC | PRN

## 2019-08-03 MED ORDER — KETOROLAC TROMETHAMINE 30 MG/ML IJ SOLN
INTRAMUSCULAR | Status: AC
  Filled 2019-08-03: qty 1

## 2019-08-03 MED ORDER — ONDANSETRON HCL 4 MG/2ML IJ SOLN
INTRAMUSCULAR | Status: DC | PRN
  Administered 2019-08-03: 4 mg via INTRAVENOUS

## 2019-08-03 MED ORDER — PHENYLEPHRINE 40 MCG/ML (10ML) SYRINGE FOR IV PUSH (FOR BLOOD PRESSURE SUPPORT): 80.0000 ug | PREFILLED_SYRINGE | INTRAVENOUS | Status: DC | PRN

## 2019-08-03 MED ORDER — SCOPOLAMINE 1 MG/3DAYS TD PT72
1.0000 | MEDICATED_PATCH | Freq: Once | TRANSDERMAL | Status: DC
  Administered 2019-08-03: 1.5 mg via TRANSDERMAL

## 2019-08-03 MED ORDER — NALOXONE HCL 4 MG/10ML IJ SOLN
1.0000 ug/kg/h | INTRAVENOUS | Status: DC | PRN
  Filled 2019-08-03: qty 5

## 2019-08-03 MED ORDER — ACETAMINOPHEN 500 MG PO TABS
1000.0000 mg | ORAL_TABLET | Freq: Four times a day (QID) | ORAL | Status: DC
  Administered 2019-08-04 – 2019-08-06 (×9): 1000 mg via ORAL
  Filled 2019-08-03 (×10): qty 2

## 2019-08-03 MED ORDER — METHYLERGONOVINE MALEATE 0.2 MG/ML IJ SOLN: 0.2000 mg | INTRAMUSCULAR | Status: DC | PRN

## 2019-08-03 MED ORDER — HYDROMORPHONE HCL 1 MG/ML IJ SOLN: 0.2500 mg | INTRAMUSCULAR | Status: DC | PRN

## 2019-08-03 MED ORDER — HYDROMORPHONE HCL 1 MG/ML IJ SOLN: 0.2000 mg | INTRAMUSCULAR | Status: DC | PRN

## 2019-08-03 MED ORDER — PRENATAL MULTIVITAMIN CH
1.0000 | ORAL_TABLET | Freq: Every day | ORAL | Status: DC
  Administered 2019-08-04 – 2019-08-05 (×2): 1 via ORAL
  Filled 2019-08-03 (×2): qty 1

## 2019-08-03 MED ORDER — ACETAMINOPHEN 325 MG PO TABS: 650.0000 mg | ORAL_TABLET | ORAL | Status: DC | PRN

## 2019-08-03 MED ORDER — DIPHENHYDRAMINE HCL 50 MG/ML IJ SOLN: 12.5000 mg | INTRAMUSCULAR | Status: DC | PRN

## 2019-08-03 MED ORDER — DIBUCAINE (PERIANAL) 1 % EX OINT: 1.0000 "application " | TOPICAL_OINTMENT | CUTANEOUS | Status: DC | PRN

## 2019-08-03 MED ORDER — OXYTOCIN-SODIUM CHLORIDE 30-0.9 UT/500ML-% IV SOLN
INTRAVENOUS | Status: AC
  Filled 2019-08-03: qty 500

## 2019-08-03 MED ORDER — SODIUM BICARBONATE 8.4 % IV SOLN
INTRAVENOUS | Status: DC | PRN
  Administered 2019-08-03: 4 mL via EPIDURAL
  Administered 2019-08-03: 2 mL via EPIDURAL
  Administered 2019-08-03: 4 mL via EPIDURAL
  Administered 2019-08-03: 3 mL via EPIDURAL

## 2019-08-03 MED ORDER — MENTHOL 3 MG MT LOZG: 1.0000 | LOZENGE | OROMUCOSAL | Status: DC | PRN

## 2019-08-03 MED ORDER — NALBUPHINE HCL 10 MG/ML IJ SOLN: 5.0000 mg | INTRAMUSCULAR | Status: DC | PRN

## 2019-08-03 MED ORDER — ONDANSETRON HCL 4 MG/2ML IJ SOLN: 4.0000 mg | Freq: Four times a day (QID) | INTRAMUSCULAR | Status: DC | PRN

## 2019-08-03 MED ORDER — FENTANYL CITRATE (PF) 100 MCG/2ML IJ SOLN
INTRAMUSCULAR | Status: AC
  Filled 2019-08-03: qty 2

## 2019-08-03 MED ORDER — LACTATED RINGERS IV SOLN: INTRAVENOUS | Status: DC

## 2019-08-03 MED ORDER — MORPHINE SULFATE (PF) 0.5 MG/ML IJ SOLN
INTRAMUSCULAR | Status: DC | PRN
  Administered 2019-08-03: 3 mg via EPIDURAL

## 2019-08-03 MED ORDER — ENOXAPARIN SODIUM 60 MG/0.6ML ~~LOC~~ SOLN
50.0000 mg | SUBCUTANEOUS | Status: DC
  Administered 2019-08-04 – 2019-08-05 (×2): 50 mg via SUBCUTANEOUS
  Filled 2019-08-03 (×2): qty 0.6

## 2019-08-03 MED ORDER — FENTANYL CITRATE (PF) 100 MCG/2ML IJ SOLN
INTRAMUSCULAR | Status: DC | PRN
  Administered 2019-08-03: 100 ug via EPIDURAL

## 2019-08-03 MED ORDER — SIMETHICONE 80 MG PO CHEW
80.0000 mg | CHEWABLE_TABLET | ORAL | Status: DC
  Administered 2019-08-04 – 2019-08-06 (×3): 80 mg via ORAL
  Filled 2019-08-03 (×3): qty 1

## 2019-08-03 MED ORDER — SENNOSIDES-DOCUSATE SODIUM 8.6-50 MG PO TABS
2.0000 | ORAL_TABLET | ORAL | Status: DC
  Administered 2019-08-04 – 2019-08-05 (×2): 2 via ORAL
  Filled 2019-08-03 (×3): qty 2

## 2019-08-03 MED ORDER — MISOPROSTOL 50MCG HALF TABLET
ORAL_TABLET | ORAL | Status: AC
  Filled 2019-08-03: qty 1

## 2019-08-03 MED ORDER — LIDOCAINE HCL (PF) 1 % IJ SOLN
INTRAMUSCULAR | Status: DC | PRN
  Administered 2019-08-03: 5 mL via EPIDURAL

## 2019-08-03 MED ORDER — CEFAZOLIN SODIUM-DEXTROSE 2-4 GM/100ML-% IV SOLN: 2.0000 g | INTRAVENOUS | Status: DC

## 2019-08-03 MED ORDER — SCOPOLAMINE 1 MG/3DAYS TD PT72
MEDICATED_PATCH | TRANSDERMAL | Status: AC
  Filled 2019-08-03: qty 1

## 2019-08-03 MED ORDER — SODIUM CHLORIDE (PF) 0.9 % IJ SOLN
INTRAMUSCULAR | Status: DC | PRN
  Administered 2019-08-03: 12 mL/h via EPIDURAL

## 2019-08-03 MED ORDER — DIPHENHYDRAMINE HCL 25 MG PO CAPS: 25.0000 mg | ORAL_CAPSULE | Freq: Four times a day (QID) | ORAL | Status: DC | PRN

## 2019-08-03 MED ORDER — SODIUM CHLORIDE 0.9 % IR SOLN
Status: DC | PRN
  Administered 2019-08-03: 1000 mL

## 2019-08-03 MED ORDER — MORPHINE SULFATE (PF) 0.5 MG/ML IJ SOLN
INTRAMUSCULAR | Status: AC
  Filled 2019-08-03: qty 10

## 2019-08-03 MED ORDER — DIPHENHYDRAMINE HCL 25 MG PO CAPS: 25.0000 mg | ORAL_CAPSULE | ORAL | Status: DC | PRN

## 2019-08-03 MED ORDER — CEFAZOLIN SODIUM-DEXTROSE 2-4 GM/100ML-% IV SOLN
INTRAVENOUS | Status: AC
  Filled 2019-08-03: qty 100

## 2019-08-03 MED ORDER — CEFAZOLIN SODIUM-DEXTROSE 2-3 GM-%(50ML) IV SOLR
INTRAVENOUS | Status: DC | PRN
  Administered 2019-08-03: 2 g via INTRAVENOUS

## 2019-08-03 MED ORDER — DEXAMETHASONE SODIUM PHOSPHATE 10 MG/ML IJ SOLN
INTRAMUSCULAR | Status: AC
  Filled 2019-08-03: qty 1

## 2019-08-03 MED ORDER — OXYCODONE-ACETAMINOPHEN 5-325 MG PO TABS: 1.0000 | ORAL_TABLET | ORAL | Status: DC | PRN

## 2019-08-03 MED ORDER — OXYTOCIN-SODIUM CHLORIDE 30-0.9 UT/500ML-% IV SOLN: 2.5000 [IU]/h | INTRAVENOUS | Status: AC

## 2019-08-03 MED ORDER — PENICILLIN G POT IN DEXTROSE 60000 UNIT/ML IV SOLN
3.0000 10*6.[IU] | INTRAVENOUS | Status: DC
  Administered 2019-08-03 (×3): 3 10*6.[IU] via INTRAVENOUS
  Filled 2019-08-03 (×3): qty 50

## 2019-08-03 MED ORDER — DEXAMETHASONE SODIUM PHOSPHATE 10 MG/ML IJ SOLN
INTRAMUSCULAR | Status: DC | PRN
  Administered 2019-08-03: 10 mg via INTRAVENOUS

## 2019-08-03 MED ORDER — SOD CITRATE-CITRIC ACID 500-334 MG/5ML PO SOLN
30.0000 mL | ORAL | Status: DC | PRN
  Administered 2019-08-03: 30 mL via ORAL
  Filled 2019-08-03: qty 30

## 2019-08-03 MED ORDER — IBUPROFEN 800 MG PO TABS
800.0000 mg | ORAL_TABLET | Freq: Three times a day (TID) | ORAL | Status: DC
  Administered 2019-08-04 – 2019-08-06 (×7): 800 mg via ORAL
  Filled 2019-08-03 (×6): qty 1

## 2019-08-03 MED ORDER — OXYTOCIN-SODIUM CHLORIDE 30-0.9 UT/500ML-% IV SOLN
2.5000 [IU]/h | INTRAVENOUS | Status: DC
  Filled 2019-08-03: qty 500

## 2019-08-03 MED ORDER — ZOLPIDEM TARTRATE 5 MG PO TABS: 5.0000 mg | ORAL_TABLET | Freq: Every evening | ORAL | Status: DC | PRN

## 2019-08-03 MED ORDER — OXYTOCIN-SODIUM CHLORIDE 30-0.9 UT/500ML-% IV SOLN
INTRAVENOUS | Status: DC | PRN
  Administered 2019-08-03: 30 [IU] via INTRAVENOUS

## 2019-08-03 MED ORDER — OXYCODONE HCL 5 MG/5ML PO SOLN: 5.0000 mg | Freq: Once | ORAL | Status: DC | PRN

## 2019-08-03 MED ORDER — SODIUM CHLORIDE 0.9 % IV SOLN
5.0000 10*6.[IU] | Freq: Once | INTRAVENOUS | Status: AC
  Administered 2019-08-03: 5 10*6.[IU] via INTRAVENOUS
  Filled 2019-08-03: qty 5

## 2019-08-03 MED ORDER — SODIUM CHLORIDE 0.9% FLUSH: 3.0000 mL | INTRAVENOUS | Status: DC | PRN

## 2019-08-03 MED ORDER — OXYCODONE-ACETAMINOPHEN 5-325 MG PO TABS: 2.0000 | ORAL_TABLET | ORAL | Status: DC | PRN

## 2019-08-03 MED ORDER — FERROUS SULFATE 325 (65 FE) MG PO TABS
325.0000 mg | ORAL_TABLET | Freq: Two times a day (BID) | ORAL | Status: DC
  Administered 2019-08-04 – 2019-08-06 (×5): 325 mg via ORAL
  Filled 2019-08-03 (×5): qty 1

## 2019-08-03 MED ORDER — STERILE WATER FOR IRRIGATION IR SOLN
Status: DC | PRN
  Administered 2019-08-03: 1000 mL

## 2019-08-03 MED ORDER — MISOPROSTOL 50MCG HALF TABLET
50.0000 ug | ORAL_TABLET | Freq: Once | ORAL | Status: AC
  Administered 2019-08-03: 50 ug via BUCCAL

## 2019-08-03 MED ORDER — FENTANYL-BUPIVACAINE-NACL 0.5-0.125-0.9 MG/250ML-% EP SOLN
12.0000 mL/h | EPIDURAL | Status: DC | PRN
  Filled 2019-08-03: qty 250

## 2019-08-03 MED ORDER — LACTATED RINGERS IV SOLN
500.0000 mL | INTRAVENOUS | Status: DC | PRN
  Administered 2019-08-03: 1000 mL via INTRAVENOUS
  Administered 2019-08-03: 500 mL via INTRAVENOUS

## 2019-08-03 MED ORDER — SIMETHICONE 80 MG PO CHEW
80.0000 mg | CHEWABLE_TABLET | Freq: Three times a day (TID) | ORAL | Status: DC
  Administered 2019-08-04 – 2019-08-06 (×7): 80 mg via ORAL
  Filled 2019-08-03 (×7): qty 1

## 2019-08-03 MED ORDER — LIDOCAINE HCL (PF) 1 % IJ SOLN: 30.0000 mL | INTRAMUSCULAR | Status: DC | PRN

## 2019-08-03 MED ORDER — OXYCODONE HCL 5 MG PO TABS
5.0000 mg | ORAL_TABLET | ORAL | Status: DC | PRN
  Administered 2019-08-04: 5 mg via ORAL
  Administered 2019-08-04 – 2019-08-05 (×3): 10 mg via ORAL
  Administered 2019-08-05: 5 mg via ORAL
  Administered 2019-08-06 (×2): 10 mg via ORAL
  Filled 2019-08-03: qty 2
  Filled 2019-08-03: qty 1
  Filled 2019-08-03 (×2): qty 2
  Filled 2019-08-03: qty 1
  Filled 2019-08-03 (×2): qty 2

## 2019-08-03 MED ORDER — COCONUT OIL OIL: 1.0000 "application " | TOPICAL_OIL | Status: DC | PRN

## 2019-08-03 MED ORDER — NALOXONE HCL 0.4 MG/ML IJ SOLN: 0.4000 mg | INTRAMUSCULAR | Status: DC | PRN

## 2019-08-03 MED ORDER — TERBUTALINE SULFATE 1 MG/ML IJ SOLN
0.2500 mg | Freq: Once | INTRAMUSCULAR | Status: DC | PRN
  Filled 2019-08-03: qty 1

## 2019-08-03 MED ORDER — LACTATED RINGERS IV SOLN
500.0000 mL | Freq: Once | INTRAVENOUS | Status: AC
  Administered 2019-08-03: 500 mL via INTRAVENOUS

## 2019-08-03 MED ORDER — KETOROLAC TROMETHAMINE 30 MG/ML IJ SOLN
30.0000 mg | Freq: Once | INTRAMUSCULAR | Status: AC | PRN
  Administered 2019-08-03: 30 mg via INTRAVENOUS

## 2019-08-03 MED ORDER — SIMETHICONE 80 MG PO CHEW: 80.0000 mg | CHEWABLE_TABLET | ORAL | Status: DC | PRN

## 2019-08-03 SURGICAL SUPPLY — 44 items
APL SKNCLS STERI-STRIP NONHPOA (GAUZE/BANDAGES/DRESSINGS) ×1
BARRIER ADHS 3X4 INTERCEED (GAUZE/BANDAGES/DRESSINGS) ×2 IMPLANT
BENZOIN TINCTURE PRP APPL 2/3 (GAUZE/BANDAGES/DRESSINGS) ×3 IMPLANT
BRR ADH 4X3 ABS CNTRL BYND (GAUZE/BANDAGES/DRESSINGS) ×1
CHLORAPREP W/TINT 26ML (MISCELLANEOUS) ×3 IMPLANT
CLAMP CORD UMBIL (MISCELLANEOUS) IMPLANT
CLOSURE WOUND 1/2 X4 (GAUZE/BANDAGES/DRESSINGS) ×1
CLOTH BEACON ORANGE TIMEOUT ST (SAFETY) ×3 IMPLANT
DRESSING PREVENA PLUS CUSTOM (GAUZE/BANDAGES/DRESSINGS) IMPLANT
DRSG OPSITE POSTOP 4X10 (GAUZE/BANDAGES/DRESSINGS) ×3 IMPLANT
DRSG PREVENA PLUS CUSTOM (GAUZE/BANDAGES/DRESSINGS) ×3
ELECT REM PT RETURN 9FT ADLT (ELECTROSURGICAL) ×3
ELECTRODE REM PT RTRN 9FT ADLT (ELECTROSURGICAL) ×1 IMPLANT
EXTRACTOR VACUUM KIWI (MISCELLANEOUS) IMPLANT
GLOVE BIOGEL M 7.0 STRL (GLOVE) ×6 IMPLANT
GLOVE BIOGEL PI IND STRL 7.0 (GLOVE) ×3 IMPLANT
GLOVE BIOGEL PI INDICATOR 7.0 (GLOVE) ×6
GOWN STRL REUS W/TWL LRG LVL3 (GOWN DISPOSABLE) ×9 IMPLANT
HEMOSTAT ARISTA ABSORB 3G PWDR (HEMOSTASIS) ×2 IMPLANT
HOVERMATT SINGLE USE (MISCELLANEOUS) ×2 IMPLANT
KIT ABG SYR 3ML LUER SLIP (SYRINGE) IMPLANT
NDL HYPO 25X5/8 SAFETYGLIDE (NEEDLE) IMPLANT
NEEDLE HYPO 25X5/8 SAFETYGLIDE (NEEDLE) ×3 IMPLANT
NS IRRIG 1000ML POUR BTL (IV SOLUTION) ×3 IMPLANT
PACK C SECTION WH (CUSTOM PROCEDURE TRAY) ×3 IMPLANT
PAD OB MATERNITY 4.3X12.25 (PERSONAL CARE ITEMS) ×3 IMPLANT
PENCIL SMOKE EVAC W/HOLSTER (ELECTROSURGICAL) ×3 IMPLANT
RTRCTR C-SECT PINK 25CM LRG (MISCELLANEOUS) IMPLANT
SPONGE LAP 18X18 X RAY DECT (DISPOSABLE) ×2 IMPLANT
STRIP CLOSURE SKIN 1/2X4 (GAUZE/BANDAGES/DRESSINGS) ×2 IMPLANT
SUT MON AB 4-0 PS1 27 (SUTURE) ×2 IMPLANT
SUT PDS AB 0 CT1 27 (SUTURE) ×6 IMPLANT
SUT PLAIN 0 NONE (SUTURE) IMPLANT
SUT PLAIN 2 0 XLH (SUTURE) ×2 IMPLANT
SUT VIC AB 0 CTX 36 (SUTURE) ×9
SUT VIC AB 0 CTX36XBRD ANBCTRL (SUTURE) ×3 IMPLANT
SUT VIC AB 2-0 CT1 27 (SUTURE) ×3
SUT VIC AB 2-0 CT1 TAPERPNT 27 (SUTURE) ×1 IMPLANT
SUT VIC AB 3-0 SH 27 (SUTURE)
SUT VIC AB 3-0 SH 27X BRD (SUTURE) IMPLANT
SUT VIC AB 4-0 KS 27 (SUTURE) ×3 IMPLANT
TOWEL OR 17X24 6PK STRL BLUE (TOWEL DISPOSABLE) ×3 IMPLANT
TRAY FOLEY W/BAG SLVR 14FR LF (SET/KITS/TRAYS/PACK) ×3 IMPLANT
WATER STERILE IRR 1000ML POUR (IV SOLUTION) ×3 IMPLANT

## 2019-08-03 NOTE — Progress Notes (Signed)
Melissa Spears is a 39 y.o. G1P0 at [redacted]w[redacted]d by  admitted for rupture of membranes  Subjective: Patient is uncomfortable with contractions continued leakage of clear fluid + FM   Objective: BP (!) 128/59 (BP Location: Right Arm)    Pulse 99    Temp 97.7 F (36.5 C) (Oral)    Resp (!) 22    Ht 4\' 10"  (1.473 m)    Wt 103.9 kg    LMP 12/23/2018    SpO2 97%    BMI 47.88 kg/m  No intake/output data recorded. No intake/output data recorded.  FHT:  FHR: 140 bpm, variability: moderate,  accelerations:  Abscent,  decelerations:  Present Variable  UC:   irregular, every 1-4 minutes SVE:   Dilation: 2.5 Effacement (%): 80 Station: -3 Exam by:: Dr. Landry Mellow IUPC and FSE placed  Labs: Lab Results  Component Value Date   WBC 13.0 (H) 08/03/2019   HGB 12.2 08/03/2019   HCT 37.6 08/03/2019   MCV 91.7 08/03/2019   PLT 317 08/03/2019    Assessment / Plan: Augmentation of labor, progressing well  Labor: Progressing normally Preeclampsia:  labs stable Fetal Wellbeing:  Category II Pain Control:  IV pain meds I/D:  Penicillin Will monitor closely d/w pt cesarean section in the event repetitive late decelerations occur. R/b/a of cesarean section discussed including but not limited to infection / bleeding / damage to bowel bladder and baby with the need for further surgery. Pt voiced understaning. Will continue pitocin for now .. if fhr becomes nonreassuring plan to stop pitocin and move toward cesarean section   Christophe Louis 08/03/2019, 2:28 PM

## 2019-08-03 NOTE — Progress Notes (Signed)
   Subjective: Called to see patient due to repetitive late decelerations.   Objective: BP (!) 129/57 (BP Location: Right Arm)   Pulse 97   Temp 98.1 F (36.7 C)   Resp 18   Ht 4\' 10"  (1.473 m)   Wt 103.9 kg   LMP 12/23/2018   SpO2 97%   BMI 47.88 kg/m  No intake/output data recorded. Total I/O In: -  Out: 200 [Urine:200]  FHT:  FHR: 140 bpm, variability: moderate,  accelerations:  Present,  decelerations:  Present late and variable  UC:   regular, every 2-4 minutes SVE:   Dilation: 4 Effacement (%): 100 Station: -3 Exam by:: Curly [place], RN  Labs: Lab Results  Component Value Date   WBC 13.2 (H) 08/03/2019   HGB 12.9 08/03/2019   HCT 39.5 08/03/2019   MCV 91.4 08/03/2019   PLT 313 08/03/2019    Assessment / Plan: Nonreassuring fetal heart rate.. recommend cesarean section. R/B/A discussed. Pt voiced understanding and desires to proceed   Christophe Louis 08/03/2019, 5:27 PM

## 2019-08-03 NOTE — Discharge Instructions (Signed)
Advance Directive  Advance directives are legal documents that let you make choices ahead of time about your health care and medical treatment in case you become unable to communicate for yourself. Advance directives are a way for you to make known your wishes to family, friends, and health care providers. This can let others know about your end-of-life care if you become unable to communicate. Discussing and writing advance directives should happen over time rather than all at once. Advance directives can be changed depending on your situation and what you want, even after you have signed the advance directives. There are different types of advance directives, such as:  Medical power of attorney.  Living will.  Do not resuscitate (DNR) or do not attempt resuscitation (DNAR) order. Health care proxy and medical power of attorney A health care proxy is also called a health care agent. This is a person who is appointed to make medical decisions for you in cases where you are unable to make the decisions yourself. Generally, people choose someone they know well and trust to represent their preferences. Make sure to ask this person for an agreement to act as your proxy. A proxy may have to exercise judgment in the event of a medical decision for which your wishes are not known. A medical power of attorney is a legal document that names your health care proxy. Depending on the laws in your state, after the document is written, it may also need to be:  Signed.  Notarized.  Dated.  Copied.  Witnessed.  Incorporated into your medical record. You may also want to appoint someone to manage your money in a situation in which you are unable to do so. This is called a durable power of attorney for finances. It is a separate legal document from the durable power of attorney for health care. You may choose the same person or someone different from your health care proxy to act as your agent in money  matters. If you do not appoint a proxy, or if there is a concern that the proxy is not acting in your best interests, a court may appoint a guardian to act on your behalf. Living will A living will is a set of instructions that state your wishes about medical care when you cannot express them yourself. Health care providers should keep a copy of your living will in your medical record. You may want to give a copy to family members or friends. To alert caregivers in case of an emergency, you can place a card in your wallet to let them know that you have a living will and where they can find it. A living will is used if you become:  Terminally ill.  Disabled.  Unable to communicate or make decisions. Items to consider in your living will include:  To use or not to use life-support equipment, such as dialysis machines and breathing machines (ventilators).  A DNR or DNAR order. This tells health care providers not to use cardiopulmonary resuscitation (CPR) if breathing or heartbeat stops.  To use or not to use tube feeding.  To be given or not to be given food and fluids.  Comfort (palliative) care when the goal becomes comfort rather than a cure.  Donation of organs and tissues. A living will does not give instructions for distributing your money and property if you should pass away. DNR or DNAR A DNR or DNAR order is a request not to have CPR in the event that  your heart stops beating or you stop breathing. If a DNR or DNAR order has not been made and shared, a health care provider will try to help any patient whose heart has stopped or who has stopped breathing. If you plan to have surgery, talk with your health care provider about how your DNR or DNAR order will be followed if problems occur. What if I do not have an advance directive? If you do not have an advance directive, some states assign family decision makers to act on your behalf based on how closely you are related to them. Each  state has its own laws about advance directives. You may want to check with your health care provider, attorney, or state representative about the laws in your state. Summary  Advance directives are the legal documents that allow you to make choices ahead of time about your health care and medical treatment in case you become unable to tell others about your care.  The process of discussing and writing advance directives should happen over time. You can change the advance directives, even after you have signed them.  Advance directives include DNR or DNAR orders, living wills, and designating an agent as your medical power of attorney. This information is not intended to replace advice given to you by your health care provider. Make sure you discuss any questions you have with your health care provider. Document Revised: 09/04/2018 Document Reviewed: 09/04/2018 Elsevier Patient Education  2020 [employer]  

## 2019-08-03 NOTE — Transfer of Care (Signed)
Immediate Anesthesia Transfer of Care Note  Patient: Melissa Spears  Procedure(s) Performed: CESAREAN SECTION (N/A )  Patient Location: PACU  Anesthesia Type:Epidural  Level of Consciousness: awake  Airway & Oxygen Therapy: Patient Spontanous Breathing  Post-op Assessment: Report given to RN  Post vital signs: Reviewed and stable  Last Vitals:  Vitals Value Taken Time  BP    Temp    Pulse 85 08/03/19 1945  Resp 21 08/03/19 1945  SpO2 97 % 08/03/19 1945  Vitals shown include unvalidated device data.  Last Pain:  Vitals:   08/03/19 1731  TempSrc: Oral  PainSc:          Complications: No complications documented.

## 2019-08-03 NOTE — Anesthesia Procedure Notes (Signed)
Epidural Patient location during procedure: OB Start time: 08/03/2019 3:46 PM End time: 08/03/2019 4:03 PM  Staffing Anesthesiologist: Barnet Glasgow, MD Performed: anesthesiologist   Preanesthetic Checklist Completed: patient identified, IV checked, site marked, risks and benefits discussed, surgical consent, monitors and equipment checked, pre-op evaluation and timeout performed  Epidural Patient position: sitting Prep: DuraPrep and site prepped and draped Patient monitoring: continuous pulse ox and blood pressure Approach: midline Location: L3-L4 Injection technique: LOR air  Needle:  Needle type: Tuohy  Needle gauge: 17 G Needle length: 9 cm and 9 Needle insertion depth: 8 cm Catheter type: closed end flexible Catheter size: 19 Gauge Catheter at skin depth: 15 cm Test dose: negative  Assessment Events: blood not aspirated, injection not painful, no injection resistance, no paresthesia and negative IV test  Additional Notes Patient identified. Risks/Benefits/Options discussed with patient including but not limited to bleeding, infection, nerve damage, paralysis, failed block, incomplete pain control, headache, blood pressure changes, nausea, vomiting, reactions to medication both or allergic, itching and postpartum back pain. Confirmed with bedside nurse the patient's most recent platelet count. Confirmed with patient that they are not currently taking any anticoagulation, have any bleeding history or any family history of bleeding disorders. Patient expressed understanding and wished to proceed. All questions were answered. Sterile technique was used throughout the entire procedure. Please see nursing notes for vital signs. Test dose was given through epidural needle and negative prior to continuing to dose epidural or start infusion. Warning signs of high block given to the patient including shortness of breath, tingling/numbness in hands, complete motor block, or any  concerning symptoms with instructions to call for help. Patient was given instructions on fall risk and not to get out of bed. All questions and concerns addressed with instructions to call with any issues.  1 Attempt (S) . Patient tolerated procedure well.

## 2019-08-03 NOTE — Op Note (Signed)
Cesarean Section Procedure Note   Indications: non-reassuring fetal status  Pre-operative Diagnosis: 38 week 5 day pregnancy.  Post-operative Diagnosis: same  Surgeon: Christophe Louis M.D.  Assistants: Thurnell Lose M.D.  Anesthesia: Epidural anesthesia  ASA Class: 2   Procedure Details   The patient was seen in the Holding Room. The risks, benefits, complications, treatment options, and expected outcomes were discussed with the patient.  The patient concurred with the proposed plan, giving informed consent.  The site of surgery properly noted/marked. The patient was taken to Operating Room # C, identified as [place] and the procedure verified as C-Section Delivery. A Time Out was held and the above information confirmed.  After induction of anesthesia, the patient was draped and prepped in the usual sterile manner. A Pfannenstiel incision was made and carried down through the subcutaneous tissue to the fascia. Fascial incision was made and extended transversely. The fascia was separated from the underlying rectus tissue superiorly and inferiorly. The peritoneum was identified and entered. Peritoneal incision was extended longitudinally. The utero-vesical peritoneal reflection was incised transversely and the bladder flap was bluntly freed from the lower uterine segment. A low transverse uterine incision was made. Delivered from cephalic presentation was a  Female with Apgar scores of 8 at one minute and 9 at five minutes. After the umbilical cord was clamped and cut cord blood was obtained for evaluation. The placenta was removed intact and appeared normal. The uterine outline, tubes and ovaries appeared normal. The uterine incision was closed with running locked sutures of 0 vicryl. A second layer of 0 vicryl was used to imbricate the incision. . Hemostasis was observed. Lavage was carried out until clear. Arista was placed along the uterine incision for insured hemostasis. The peritoneum was  reapproximated with 2-0 vicryl  The fascia was then reapproximated with running sutures of 0 pds.. The skin was reapproximated with 4-0 vicryl. Martin Majestic negative pressure dressing was placed   Instrument, sponge, and needle counts were correct prior the abdominal closure and at the conclusion of the case.   Findings: Female infant in the cephalic presentation   Estimated Blood Loss:  200 mL         Drains: None         Total IV Fluids:   Per anesthesia ml         Specimens: Placenta and Disposition:  Sent to Pathology          Implants: None         Complications:  None; patient tolerated the procedure well.         Disposition: PACU - hemodynamically stable.         Condition: stable  Attending Attestation: I performed the procedure.

## 2019-08-03 NOTE — H&P (Addendum)
Melissa Spears is a 39 y.o. female, G1P0000, Dr Sundra Aland Pt of [facility] at 38.5 weeks, presenting for PROM at 0100 with large amount of clear fluids. Pt PNC signifigant for polyhydramnios, NIPS testing showed T18, amnio showed normal female fetus. EFW on 5/4 was 4.10lbs. PN labs included A+, hgb 12.7-11.9, GBS+ on 5/25, RPR NR, HIV NR, Hep B Neg, GC/c Neg, Rubella immune, Passed 1H GTT. Pt endorse + Fm. Denies vaginal bleeding. Endorses occ cxt feeling. Had elevated BP in MAU, Preeclampsia labs pending, denies HA, RUQ pain or vision changes.   Patient Active Problem List   Diagnosis Date Noted  . PROM (premature rupture of membranes) 08/03/2019  . Abnormal maternal serum screening test   . [redacted] weeks gestation of pregnancy   . Advanced maternal age in multigravida, second trimester   . Black-out (not amnesia) 06/15/2014     Medications Prior to Admission  Medication Sig Dispense Refill Last Dose  . Prenatal Vit-Fe Fumarate-FA (PRENATAL VITAMIN PO) Take by mouth.   08/02/2019 at Unknown time  . Acetaminophen (TYLENOL PO) Take by mouth.     . tizanidine (ZANAFLEX) 6 MG capsule 1 tab po q hs (Patient not taking: Reported on 04/13/2019) 10 capsule 0   . traMADol (ULTRAM) 50 MG tablet 1-2 tab po q 8  hours as needed prn pain (Patient not taking: Reported on 04/13/2019) 40 tablet 0     Past Medical History:  Diagnosis Date  . Amenorrhea 2000   Unknown cause  . Pituitary adenoma ([place])   . Prolactinoma ([place])   . Varicose veins      No current facility-administered medications on file prior to encounter.   Current Outpatient Medications on File Prior to Encounter  Medication Sig Dispense Refill  . Prenatal Vit-Fe Fumarate-FA (PRENATAL VITAMIN PO) Take by mouth.    . Acetaminophen (TYLENOL PO) Take by mouth.    . tizanidine (ZANAFLEX) 6 MG capsule 1 tab po q hs (Patient not taking: Reported on 04/13/2019) 10 capsule 0  . traMADol (ULTRAM) 50 MG tablet 1-2 tab po q 8  hours as needed prn  pain (Patient not taking: Reported on 04/13/2019) 40 tablet 0     No Known Allergies   OB History    Gravida  1   Para      Term      Preterm      AB      Living        SAB      TAB      Ectopic      Multiple      Live Births             Past Medical History:  Diagnosis Date  . Amenorrhea 2000   Unknown cause  . Pituitary adenoma ([place])   . Prolactinoma ([place])   . Varicose veins    Past Surgical History:  Procedure Laterality Date  . TONSILLECTOMY AND ADENOIDECTOMY    . TONSILLECTOMY AND ADENOIDECTOMY     Family History: family history includes Diabetes in her maternal grandmother and mother; Heart attack in her maternal grandfather; Heart failure in her brother; Heart murmur in her brother; Hypertension in her maternal grandmother and mother; Hypotension in her brother; Kidney disease in her maternal grandmother and mother. Social History:  reports that she has never smoked. She has never used smokeless tobacco. She reports that she does not drink alcohol and does not use drugs.   Prenatal Transfer Tool  Maternal Diabetes: No Genetic Screening: Abnormal:  Results: Elevated risk of Trisomy 32, Other:amnio performed and normal female fetus.  Maternal Ultrasounds/Referrals: Normal Fetal Ultrasounds or other Referrals:  None Maternal Substance Abuse:  No Significant Maternal Medications:  None Significant Maternal Lab Results: Group B Strep positive  ROS:  Review of Systems  Constitutional: Negative.   HENT: Negative.   Eyes: Negative.   Respiratory: Negative.   Cardiovascular: Negative.   Gastrointestinal: Negative.   Genitourinary: Negative.        Leakage of fluids   Musculoskeletal: Negative.   Skin: Negative.      Physical Exam: BP 127/66   Pulse 95   Temp 98.1 F (36.7 C) (Oral)   Resp 16   Ht 4\' 10"  (1.473 m)   Wt 103.9 kg   LMP 12/23/2018   SpO2 99%   BMI 47.88 kg/m   Physical Exam Vitals and nursing note reviewed.   Constitutional:      Appearance: Normal appearance. She is obese.  HENT:     Head: Normocephalic and atraumatic.     Nose: Nose normal.     Mouth/Throat:     Mouth: Mucous membranes are moist.     Pharynx: Oropharynx is clear.  Eyes:     Extraocular Movements: Extraocular movements intact.     Conjunctiva/sclera: Conjunctivae normal.     Pupils: Pupils are equal, round, and reactive to light.  Cardiovascular:     Rate and Rhythm: Normal rate and regular rhythm.     Pulses: Normal pulses.     Heart sounds: Normal heart sounds.  Pulmonary:     Effort: Pulmonary effort is normal.     Breath sounds: Normal breath sounds.  Abdominal:     General: Bowel sounds are normal.     Palpations: Abdomen is soft.     Comments: Gravida  Genitourinary:    General: Normal vulva.     Rectum: Normal.     Comments: Pelvis adequate for vaginal delivery. Gravida uterus greater then dates.  Musculoskeletal:        General: Normal range of motion.     Cervical back: Normal range of motion and neck supple.  Skin:    General: Skin is warm and dry.     Capillary Refill: Capillary refill takes less than 2 seconds.  Neurological:     General: No focal deficit present.     Mental Status: She is alert and oriented to person, place, and time.     Deep Tendon Reflexes: Reflexes normal.     Comments: No clonus  Psychiatric:        Mood and Affect: Mood normal.      NST: FHR baseline 150 bpm, Variability: moderate, Accelerations:present, Decelerations:  Absent= Cat 1/Reactive UC:   occ SVE:   Dilation: 1 Effacement (%): 30 Station: -3, Ballotable Exam by:: [employer], vertex verified by fetal sutures.  Leopold's: Position vertex, EFW 7.5lbs via leopold's.   Labs: Results for orders placed or performed during the hospital encounter of 08/03/19 (from the past 24 hour(s))  POCT fern test     Status: Abnormal   Collection Time: 08/03/19  1:23 AM  Result Value Ref Range   POCT Fern Test  Positive = ruptured amniotic membanes   SARS Coronavirus 2 by RT PCR (hospital order, performed in [facility] hospital lab) Nasopharyngeal Nasopharyngeal Swab     Status: None   Collection Time: 08/03/19  1:41 AM   Specimen: Nasopharyngeal Swab  Result Value  Ref Range   SARS Coronavirus 2 NEGATIVE NEGATIVE  CBC     Status: Abnormal   Collection Time: 08/03/19  2:18 AM  Result Value Ref Range   WBC 13.0 (H) 4.0 - 10.5 K/uL   RBC 4.10 3.87 - 5.11 MIL/uL   Hemoglobin 12.2 12.0 - 15.0 g/dL   HCT 37.6 36 - 46 %   MCV 91.7 80.0 - 100.0 fL   MCH 29.8 26.0 - 34.0 pg   MCHC 32.4 30.0 - 36.0 g/dL   RDW 14.1 11.5 - 15.5 %   Platelets 317 150 - 400 K/uL   nRBC 0.0 0.0 - 0.2 %  Comprehensive metabolic panel     Status: Abnormal   Collection Time: 08/03/19  2:18 AM  Result Value Ref Range   Sodium 138 135 - 145 mmol/L   Potassium 4.2 3.5 - 5.1 mmol/L   Chloride 107 98 - 111 mmol/L   CO2 21 (L) 22 - 32 mmol/L   Glucose, Bld 130 (H) 70 - 99 mg/dL   BUN 14 6 - 20 mg/dL   Creatinine, Ser 0.75 0.44 - 1.00 mg/dL   Calcium 8.8 (L) 8.9 - 10.3 mg/dL   Total Protein 5.6 (L) 6.5 - 8.1 g/dL   Albumin 2.3 (L) 3.5 - 5.0 g/dL   AST 32 15 - 41 U/L   ALT 29 0 - 44 U/L   Alkaline Phosphatase 128 (H) 38 - 126 U/L   Total Bilirubin 0.4 0.3 - 1.2 mg/dL   GFR calc non Af Amer >60 >60 mL/min   GFR calc Af Amer >60 >60 mL/min   Anion gap 10 5 - 15  Lactate dehydrogenase     Status: Abnormal   Collection Time: 08/03/19  2:18 AM  Result Value Ref Range   LDH 250 (H) 98 - 192 U/L  Uric acid     Status: None   Collection Time: 08/03/19  2:18 AM  Result Value Ref Range   Uric Acid, Serum 5.4 2.5 - 7.1 mg/dL  Type and screen [place]     Status: None   Collection Time: 08/03/19  2:25 AM  Result Value Ref Range   ABO/RH(D) A POS    Antibody Screen NEG    Sample Expiration      08/06/2019,2359 Performed at [facility], [address], [place], [place] [address]   ABO/Rh      Status: None (Preliminary result)   Collection Time: 08/03/19  2:25 AM  Result Value Ref Range   ABO/RH(D)      A POS Performed at [facility] [address] [place], [place] [address]   Protein / creatinine ratio, urine     Status: Abnormal   Collection Time: 08/03/19  3:14 AM  Result Value Ref Range   Creatinine, Urine 64.27 mg/dL   Total Protein, Urine 18 mg/dL   Protein Creatinine Ratio 0.28 (H) 0.00 - 0.15 mg/mg[Cre]    Imaging:  No results found.  MAU Course: Orders Placed This Encounter  Procedures  . SARS Coronavirus 2 by RT PCR (hospital order, performed in [facility] hospital lab) Nasopharyngeal Nasopharyngeal Swab  . CBC  . RPR  . Comprehensive metabolic panel  . Lactate dehydrogenase  . Uric acid  . Protein / creatinine ratio, urine  . Diet clear liquid Room service appropriate? Yes; Fluid consistency: Thin  . Contraction - monitoring  . External fetal heart monitoring  . Vaginal exam  . Vitals signs  per unit policy  . Notify Physician  . Fetal monitoring per unit policy  . Activity as tolerated  . Cervical Exam  . Measure blood pressure post delivery every 15 min x 1 hour then every 30 min x 1 hour  . Fundal check post delivery every 15 min x 1 hour then every 30 min x 1 hour  . If Rapid HIV test positive or known HIV positive: initiate AZT orders  . May in and out cath x 2 for inability to void  . Insert foley catheter  . Discontinue foley prior to vaginal delivery  . Initiate Carrier Fluid Protocol  . Initiate Oral Care Protocol  . Order Rapid HIV per protocol if no results on chart  . Patient may have epidural placement upon request  . Full code  . POCT fern test  . Type and screen [place]  . ABO/Rh  . Insert and maintain IV Line  . Admit to Inpatient (patient's expected length of stay will be greater than 2 midnights or inpatient only procedure)   Meds ordered this encounter  Medications  . lactated ringers  infusion  . oxytocin (PITOCIN) IV BOLUS FROM BAG  . oxytocin (PITOCIN) IV infusion 30 units in NS 500 mL - Premix  . lactated ringers infusion 500-1,000 mL  . acetaminophen (TYLENOL) tablet 650 mg  . oxyCODONE-acetaminophen (PERCOCET/ROXICET) 5-325 MG per tablet 1 tablet  . oxyCODONE-acetaminophen (PERCOCET/ROXICET) 5-325 MG per tablet 2 tablet  . ondansetron (ZOFRAN) injection 4 mg  . sodium citrate-citric acid (ORACIT) solution 30 mL  . lidocaine (PF) (XYLOCAINE) 1 % injection 30 mL  . FOLLOWED BY Linked Order Group   . penicillin G potassium 5 Million Units in sodium chloride 0.9 % 250 mL IVPB     Order Specific Question:   Antibiotic Indication:     Answer:   Group B Strep Prophylaxis   . penicillin G potassium 3 Million Units in dextrose 60mL IVPB     Order Specific Question:   Antibiotic Indication:     Answer:   Group B Strep Prophylaxis  . fentaNYL (SUBLIMAZE) injection 50-100 mcg  . misoprostol (CYTOTEC) tablet 50 mcg  . misoprostol (CYTOTEC) 50 MCG tablet    Massie Maroon   : cabinet override    Assessment/Plan: Melissa Spears is a 39 y.o. female, G1P0000, Dr Cole's Pt of [facility] at 38.5 weeks, presenting for PROM at 0100 with large amount of clear fluids. Pt PNC signifigant for polyhydramnios, NIPS testing showed T18, amnio showed normal female fetus. EFW on 5/4 was 4.10lbs. PN labs included A+, hgb 12.7-11.9, GBS+ on 5/25, RPR NR, HIV NR, Hep B Neg, GC/c Neg, Rubella immune, Passed 1H GTT. Pt endorse + Fm. Denies vaginal bleeding. Endorses occ cxt feeling. Had elevated BP in MAU, Preeclampsia labs pending, denies HA, RUQ pain or vision changes.   FWB: Cat 1 Fetal Tracing. Had occ decel noted on strip earlier, plan to allow baby to rest and after first dose of penicillin, will start cytotec for cervical ripening, if strip is Cat 1.  Plan: Admit to [place] per consult with Dr Charlesetta Garibaldi Routine [facility] orders Pain med/epidural prn PCN G for GBS  prophylaxis Plan for 61mcg buccal cytotec.  Anticipate labor progression   Plan to report off to Dr Landry Mellow at 0700.   Noralyn Pick NP-C, CNM, MSN 08/03/2019, 5:32 AM

## 2019-08-03 NOTE — Anesthesia Preprocedure Evaluation (Addendum)
Anesthesia Evaluation  Patient identified by MRN, date of birth, ID band Patient awake    Reviewed: Allergy & Precautions, NPO status , Patient's Chart, lab work & pertinent test results  Airway Mallampati: III  TM Distance: >3 FB Neck ROM: Full    Dental no notable dental hx. (+) Teeth Intact   Pulmonary neg pulmonary ROS,    Pulmonary exam normal breath sounds clear to auscultation       Cardiovascular hypertension, Normal cardiovascular exam Rhythm:Regular Rate:Normal  ? PIH   Neuro/Psych Hx of Pituitary adenoma tx medically    GI/Hepatic negative GI ROS, Neg liver ROS,   Endo/Other  negative endocrine ROS  Renal/GU negative Renal ROS     Musculoskeletal   Abdominal (+) + obese,   Peds  Hematology Lab Results      Component                Value               Date                      WBC                      13.0 (H)            08/03/2019                HGB                      12.2                08/03/2019                HCT                      37.6                08/03/2019                MCV                      91.7                08/03/2019                PLT                      317                 08/03/2019              Anesthesia Other Findings   Reproductive/Obstetrics (+) Pregnancy                            Anesthesia Physical Anesthesia Plan  ASA: III  Anesthesia Plan: Epidural   Post-op Pain Management:    Induction:   PONV Risk Score and Plan:   Airway Management Planned:   Additional Equipment:   Intra-op Plan:   Post-operative Plan:   Informed Consent: I have reviewed the patients History and Physical, chart, labs and discussed the procedure including the risks, benefits and alternatives for the proposed anesthesia with the patient or authorized representative who has indicated his/her understanding and acceptance.     Dental advisory given  Plan  Discussed with:   Anesthesia Plan Comments: (38.5 wk  G1P0 w ? PIH and IUGR for LEA)       Anesthesia Quick Evaluation

## 2019-08-03 NOTE — MAU Note (Signed)
Pt states water broke about half an hour ago prior to arrival. Pt was asleep and felt a big gush and it soaked bed. Pt reports a few contractions the way here. She denies bleeding. Reports good fetal movement. Cervix was 1cm on Tuesday.

## 2019-08-04 ENCOUNTER — Encounter (HOSPITAL_COMMUNITY): Payer: Self-pay | Admitting: Obstetrics and Gynecology

## 2019-08-04 LAB — CBC
HCT: 33.5 % — ABNORMAL LOW (ref 36.0–46.0)
Hemoglobin: 11 g/dL — ABNORMAL LOW (ref 12.0–15.0)
MCH: 29.6 pg (ref 26.0–34.0)
MCHC: 32.8 g/dL (ref 30.0–36.0)
MCV: 90.3 fL (ref 80.0–100.0)
Platelets: 279 10*3/uL (ref 150–400)
RBC: 3.71 MIL/uL — ABNORMAL LOW (ref 3.87–5.11)
RDW: 13.9 % (ref 11.5–15.5)
WBC: 17.2 10*3/uL — ABNORMAL HIGH (ref 4.0–10.5)
nRBC: 0 % (ref 0.0–0.2)

## 2019-08-04 NOTE — Lactation Note (Deleted)
This note was copied from a baby's chart. Lactation Consultation Note  Patient Name: Melissa Spears [id number] Date: 08/04/2019 Reason for consult: Follow-up assessment P1,29 hour ETI ,female infant. Per mom, she requested [facility] services due to infant refusing to latch on her left breast. LC unable assess latch at this time due mom feeding infant at 38. Mom will call LC to help assist with latch at next feeding. Infant is currently asleep in dad's arm.   Maternal Data    Feeding Feeding Type: Breast Fed Nipple Type: Slow - flow  LATCH Score             Interventions Interventions: Skin to skin;Adjust position;Support pillows;Position options;Breast compression  Lactation Tools Discussed/Used Tools: Nipple Shields Nipple shield size: 20 (short shafted)   Consult Status Consult Status: Follow-up Date: 08/04/19 Follow-up type: In-patient    Melissa Spears 08/04/2019, 11:46 PM

## 2019-08-04 NOTE — Lactation Note (Signed)
This note was copied from a baby's chart. Lactation Consultation Note  Patient Name: Melissa Spears Date: 08/04/2019 Reason for consult: Mother's request;Follow-up assessment;Difficult latch P1, 28 hour ETI female infant. Concern:  difficult latch, infant with short anterior frenulum and mom with short shaft nipples. Solution: Mom fitted and taught how to use fitted with 20 mm NS, that pre filled twice with 0.5 ml of formula 20 kcal using curve tip syringe. Mom will pump every 3 hour for 15 minutes to establish milk supply due to infant having difficulties latching at breast.  Goals: infant latch at breast without difficulty with longer duration, only use NS short term and establish her milk supply.  Mom latched infant on her right breast in the football hold with pillow support, mom fitted with 20 mm NS, mom latched infant, bringing him chin first, wide mouth, infant sustained latch will swallows breast feed for 10 minutes. Afterwards mom used the  DEBP as LC was leaving room. Dad give infant additional 11 mls of Similac 20 kcal with iron while mom was using DEBP with  slow flow bottle nipple parents choice. Mom knows to call RN or LC if she needs further assistance with latching infant at breast.    Maternal Data    Feeding Feeding Type: Breast Fed Nipple Type: Slow - flow  LATCH Score Latch: Grasps breast easily, tongue down, lips flanged, rhythmical sucking.  Audible Swallowing: Spontaneous and intermittent  Type of Nipple: Everted at rest and after stimulation  Comfort (Breast/Nipple): Soft / non-tender  Hold (Positioning): Assistance needed to correctly position infant at breast and maintain latch.  LATCH Score: 9  Interventions Interventions: Skin to skin;Adjust position;Support pillows;Position options;Breast compression  Lactation Tools Discussed/Used Tools: Nipple Shields Nipple shield size: 20 (short shafted)   Consult Status Consult Status:  Follow-up Date: 08/05/19 Follow-up type: In-patient    [name] 08/04/2019, 10:21 PM

## 2019-08-04 NOTE — Progress Notes (Signed)
Subjective: Postpartum Day 1: Cesarean Delivery Patient reports incisional pain, tolerating PO, + flatus and no problems voiding.    Objective: Vital signs in last 24 hours: Temp:  [97.7 F (36.5 C)-98.8 F (37.1 C)] 98.8 F (37.1 C) (06/15 0855) Pulse Rate:  [76-112] 88 (06/15 0855) Resp:  [17-30] 20 (06/15 1230) BP: (113-145)/(52-90) 117/52 (06/15 0855) SpO2:  [92 %-100 %] 99 % (06/15 1230)  Physical Exam:  General: alert, cooperative and no distress Lochia: appropriate Uterine Fundus: firm Incision: Prevena in place and functioning  DVT Evaluation: No evidence of DVT seen on physical exam.  Recent Labs    08/03/19 1515 08/04/19 0554  HGB 12.9 11.0*  HCT 39.5 33.5*    Assessment/Plan: Status post Cesarean section. Doing well postoperatively.  Continue current care Encouraged ambulation  Pt desires circumcision of infant.  Obesity - lovenox for dvt prophylaxis once a day while inpatient .  Christophe Louis 08/04/2019, 1:48 PM

## 2019-08-04 NOTE — Anesthesia Postprocedure Evaluation (Signed)
Anesthesia Post Note  Patient: Melissa Spears  Procedure(s) Performed: CESAREAN SECTION (N/A )     Patient location during evaluation: PACU Anesthesia Type: Epidural Level of consciousness: oriented and awake and alert Pain management: pain level controlled Vital Signs Assessment: post-procedure vital signs reviewed and stable Respiratory status: spontaneous breathing, respiratory function stable and patient connected to nasal cannula oxygen Cardiovascular status: blood pressure returned to baseline and stable Postop Assessment: no headache, no backache and no apparent nausea or vomiting Anesthetic complications: no   No complications documented.  Last Vitals:  Vitals:   08/04/19 0526 08/04/19 0630  BP: 125/75   Pulse: 76   Resp: 17 18  Temp: 36.5 C   SpO2: 96% 95%    Last Pain:  Vitals:   08/04/19 0526  TempSrc: Oral  PainSc: 0-No pain   Pain Goal:                   Effie Berkshire

## 2019-08-04 NOTE — Lactation Note (Signed)
This note was copied from a baby's chart. Lactation Consultation Note  Patient Name: Melissa Spears [id number] Date: 08/04/2019 Reason for consult: Difficult latch;Follow-up assessment;Early term 37-38.6wks P1, 25 hour ETI female infant. Concern of this Mother /Baby dyad : infant not latching at breast, infant with short anterior frenulum and mom with short shafted nipples. Tools given: breast shells, hand pump, fitted earlier by [facility] with 20 mm NS and mom is currently using DEBP every 3 hours as advised to help establish and maintain milk supply due infant not latching at breast.  Infant had 3 stools and one void. Per mom, infant has not latched since 3 pm, infant is currently was  supplement with formula 10 mls at 1845 pm. LC unable to assist with latch at this time  due infant recently being supplemented with formula. Mom will call LC for latch assistance at next feeding.   Maternal Data    Feeding Feeding Type: Bottle Fed - Formula Nipple Type: Slow - flow  LATCH Score                   Interventions Interventions: DEBP  Lactation Tools Discussed/Used     Consult Status Consult Status: Follow-up Date: 08/04/19 Follow-up type: In-patient    [name] 08/04/2019, 8:14 PM

## 2019-08-04 NOTE — Lactation Note (Addendum)
This note was copied from a baby's chart. Lactation Consultation Note  Patient Name: Melissa Spears [id number] Date: 08/04/2019 Reason for consult: Initial assessment;1st time breastfeeding;Early term 37-38.6wks P1, 5 hour ETI female with polyhydramnios. Mom with hx: C/S delivery Tools: mom given breast shells due being short shafted and slightly flat; hand pump given to pre-pump breast prior to latching infant. Per mom, her nipples did extend out more prior to her  pregnancy, changes  possibly  due to high weight gain in pregnancy and slight edema of breast.  Per mom, infant has not latched at breast, mom has made two attempts prior. RN in room and changed stool (black meconium). Per mom, she has Spectra 2 DEBP at home. Mom latched infant on her left breast using the football hold position, infant would open mouth wide but not form ceil at first when latched, LC  had mom compress her and form breast in mouth. Infant latched with nose and chin touching breast, nostril free, swallows were observed, infant latched for 16 minutes. Mom taught back hand expression and infant was given 2 mls of colostrum by spoon. Mom will breastfeed infant according to hunger cues, 8 to 12+ times within 24 hours, on demand and not exceed 3 hours without feeding infant. Mom was doing STS with infant as [place] left the room. Mom knows to call RN or LC if she needs further assistance with latching infant at the breast. Reviewed Baby & Me book's Breastfeeding Basics.  Mom made aware of O/P services, breastfeeding support groups, community resources, and our phone # for post-discharge questions.   Maternal Data Formula Feeding for Exclusion: No Has patient been taught Hand Expression?: Yes Does the patient have breastfeeding experience prior to this delivery?: No  Feeding Feeding Type: Breast Fed  LATCH Score Latch: Repeated attempts needed to sustain latch, nipple held in mouth throughout feeding, stimulation needed  to elicit sucking reflex.  Audible Swallowing: Spontaneous and intermittent  Type of Nipple: Everted at rest and after stimulation  Comfort (Breast/Nipple): Soft / non-tender  Hold (Positioning): Assistance needed to correctly position infant at breast and maintain latch.  LATCH Score: 8  Interventions Interventions: Breast compression;Breast feeding basics reviewed;Assisted with latch;Adjust position;Skin to skin;Support pillows;Hand pump;Shells;Expressed milk;Position options;Breast massage;Hand express;Pre-pump if needed  Lactation Tools Discussed/Used Tools: Shells;Pump Shell Type: Other (comment) (short shafted) [place] Program: No Pump Review: Setup, frequency, and cleaning;Milk Storage Initiated by:: [name], IBCLC Date initiated:: 08/04/19   Consult Status Consult Status: Follow-up Date: 08/04/19 Follow-up type: In-patient    [name] 08/04/2019, 12:37 AM

## 2019-08-04 NOTE — Lactation Note (Signed)
This note was copied from a baby's chart. Lactation Consultation Note  Patient Name: Melissa Spears MRAJH'H Date: 08/04/2019 Reason for consult: Follow-up assessment   Infant 23 hours old and has continued to have difficulty latching. Mother has short shaft nipples.  Fitted mother with 21 flanges on breast pump. Attempted in football and cross cradle hold. Baby sucked a few times but no pattern established. Applied #20NS and one sucking burst noted. Noted infant has short anterior lingual frenulum.  Recommend discussing with Ped MD. Recommend until infant is latching, mother pump q 3 hours. Give volume back to baby.  Mother states she is open to formula supplementation to RN. Mother will pump and depending on volume will supplement as needed.     Maternal Data    Feeding Feeding Type: Breast Fed  LATCH Score Latch: Too sleepy or reluctant, no latch achieved, no sucking elicited.  Audible Swallowing: None  Type of Nipple: Everted at rest and after stimulation  Comfort (Breast/Nipple): Soft / non-tender  Hold (Positioning): Assistance needed to correctly position infant at breast and maintain latch.  LATCH Score: 5  Interventions Interventions: Breast feeding basics reviewed;Assisted with latch;Hand express;DEBP  Lactation Tools Discussed/Used Pump Review: Setup, frequency, and cleaning;Milk Storage Initiated by::  (Reviewed RB)   Consult Status Consult Status: Follow-up Date: 08/04/19 Follow-up type: In-patient    Vivianne Master [facility] 08/04/2019, 3:24 PM

## 2019-08-05 ENCOUNTER — Inpatient Hospital Stay (HOSPITAL_COMMUNITY): Payer: Managed Care, Other (non HMO)

## 2019-08-05 ENCOUNTER — Inpatient Hospital Stay (HOSPITAL_COMMUNITY)
Admission: RE | Admit: 2019-08-05 | Payer: Managed Care, Other (non HMO) | Source: Home / Self Care | Admitting: Obstetrics and Gynecology

## 2019-08-05 LAB — SURGICAL PATHOLOGY

## 2019-08-05 NOTE — Lactation Note (Signed)
This note was copied from a baby's chart. Lactation Consultation Note  Patient Name: Melissa Spears [id number] Date: 08/05/2019   Baby Melissa Randall Hiss now 6 hours old. On phototheraphy. Mom reports her goal is to pump and offer expressed mothers in bottles.  Mom reports still not getting anything but but drops with pumping. Urged to pump every 2-3 hours .  Urged to add massage/hand expression to pumping. Urged continued STS when baby not under lights.  Urged mom to call lactation with questions/concerns.  Maternal Data    Feeding Feeding Type: Formula Nipple Type: Slow - flow  LATCH Score                   Interventions    Lactation Tools Discussed/Used     Consult Status      [place] Knaus Thompson Caul 08/05/2019, 9:26 PM

## 2019-08-05 NOTE — Progress Notes (Signed)
Subjective: Postpartum Day 2: Cesarean Delivery Patient reports incisional pain, tolerating PO, + flatus and no problems voiding.    Objective: Vital signs in last 24 hours: Temp:  [97.5 F (36.4 C)-97.9 F (36.6 C)] 97.9 F (36.6 C) (06/16 1421) Pulse Rate:  [81-96] 96 (06/16 1421) Resp:  [16-18] 16 (06/16 1421) BP: (136-140)/(57-91) 136/80 (06/16 1421) SpO2:  [98 %-100 %] 98 % (06/16 1421)  Physical Exam:  General: alert, cooperative and no distress Lochia: appropriate Uterine Fundus: firm Incision: healing well DVT Evaluation: No evidence of DVT seen on physical exam.  Recent Labs    08/03/19 1515 08/04/19 0554  HGB 12.9 11.0*  HCT 39.5 33.5*    Assessment/Plan: Status post Cesarean section. Doing well postoperatively.  Continue current care Plan discharge home tomorrow .  Christophe Louis 08/05/2019, 5:28 PM

## 2019-08-06 ENCOUNTER — Ambulatory Visit: Payer: Self-pay

## 2019-08-06 MED ORDER — IBUPROFEN 800 MG PO TABS: 800.0000 mg | ORAL_TABLET | Freq: Three times a day (TID) | ORAL | 1 refills | Status: DC | PRN

## 2019-08-06 MED ORDER — OXYCODONE HCL 5 MG PO TABS: 5.0000 mg | ORAL_TABLET | ORAL | 0 refills | Status: AC | PRN

## 2019-08-06 NOTE — Discharge Summary (Signed)
Postpartum Discharge Summary  Date of Service updated 08/06/2019     Patient Name: Melissa Spears DOB: [birth year] MRN: [id number]  Date of admission: 08/03/2019 Delivery date:08/03/2019  Delivering provider: Christophe Louis  Date of discharge: 08/06/2019  Admitting diagnosis: PROM (premature rupture of membranes) [O42.90] Intrauterine pregnancy: [redacted]w[redacted]d    Secondary diagnosis:  Active Problems:   PROM (premature rupture of membranes)  Additional problems: None    Discharge diagnosis: Term Pregnancy Delivered                                              Post partum procedures:None Augmentation: Pitocin and Cytotec Complications: None  Hospital course: Onset of Labor With Unplanned C/S   39y.o. yo G1P1001 at 382w5das admitted in Latent Labor on 08/03/2019. Patient had a labor course significant for ROM followed by po cytotec for cervical ripening. Pitocin was started after 6 hours of cytotec... pt developed recurrent late decelerations each time pitocin was started Cesarean section was recommended due to fetal intolerance of labor/ nonreassuring fetal heart rate. The patient went for cesarean section due to Non-Reassuring FHR. Delivery details as follows: Membrane Rupture Time/Date: 12:30 AM ,08/03/2019   Delivery Method:C-Section, Low Transverse  Details of operation can be found in separate operative note. Patient had an uncomplicated postpartum course.  She is ambulating,tolerating a regular diet, passing flatus, and urinating well.  Patient is discharged home in stable condition 08/06/19.  Newborn Data: Birth date:08/03/2019  Birth time:6:15 PM  Gender:Female  Living status:Living  Apgars:8 ,9  Weight:3510 g   Magnesium Sulfate received: No BMZ received: No Rhophylac:No MMR:No T-DaP:Given prenatally Flu: No Transfusion:No  Physical exam  Vitals:   08/05/19 0500 08/05/19 1421 08/05/19 2234 08/06/19 0520  BP: (!) 139/57 136/80 136/79 140/84  Pulse: 81 96 93 (!) 104   Resp: 17 16 19 20   Temp: 97.6 F (36.4 C) 97.9 F (36.6 C) 98.5 F (36.9 C) 98.3 F (36.8 C)  TempSrc: Axillary Oral Oral Oral  SpO2: 98% 98% 99% 98%  Weight:      Height:       General: alert, cooperative and no distress Lochia: appropriate Uterine Fundus: firm Incision: Dressing is clean, dry, and intact DVT Evaluation: No evidence of DVT seen on physical exam. Labs: Lab Results  Component Value Date   WBC 17.2 (H) 08/04/2019   HGB 11.0 (L) 08/04/2019   HCT 33.5 (L) 08/04/2019   MCV 90.3 08/04/2019   PLT 279 08/04/2019   CMP Latest Ref Rng & Units 08/03/2019  Glucose 70 - 99 mg/dL 130(H)  BUN 6 - 20 mg/dL 14  Creatinine 0.44 - 1.00 mg/dL 0.75  Sodium 135 - 145 mmol/L 138  Potassium 3.5 - 5.1 mmol/L 4.2  Chloride 98 - 111 mmol/L 107  CO2 22 - 32 mmol/L 21(L)  Calcium 8.9 - 10.3 mg/dL 8.8(L)  Total Protein 6.5 - 8.1 g/dL 5.6(L)  Total Bilirubin 0.3 - 1.2 mg/dL 0.4  Alkaline Phos 38 - 126 U/L 128(H)  AST 15 - 41 U/L 32  ALT 0 - 44 U/L 29   Edinburgh Score: Edinburgh Postnatal Depression Scale Screening Tool 08/04/2019  I have been able to laugh and see the funny side of things. 0  I have looked forward with enjoyment to things. 0  I have blamed myself unnecessarily when things went wrong. 1  I have been anxious or worried for no good reason. 0  I have felt scared or panicky for no good reason. 0  Things have been getting on top of me. 1  I have been so unhappy that I have had difficulty sleeping. 1  I have felt sad or miserable. 2  I have been so unhappy that I have been crying. 1  The thought of harming myself has occurred to me. 0  Edinburgh Postnatal Depression Scale Total 6      After visit meds:  Allergies as of 08/06/2019   No Known Allergies     Medication List    STOP taking these medications   traMADol 50 MG tablet Commonly known as: ULTRAM     TAKE these medications   ibuprofen 800 MG tablet Commonly known as: ADVIL Take 1 tablet (800 mg  total) by mouth every 8 (eight) hours as needed.   oxyCODONE 5 MG immediate release tablet Commonly known as: Oxy IR/ROXICODONE Take 1-2 tablets (5-10 mg total) by mouth every 4 (four) hours as needed for up to 7 days for moderate pain.   PRENATAL VITAMIN PO Take by mouth.   tizanidine 6 MG capsule Commonly known as: Zanaflex 1 tab po q hs   TYLENOL PO Take by mouth.            Discharge Care Instructions  (From admission, onward)         Start     Ordered   08/06/19 0000  Leave dressing on - Keep it clean, dry, and intact until clinic visit        08/06/19 0806           Discharge home in stable condition Infant Feeding: Breast Infant Disposition:home with mother Discharge instruction: per After Visit Summary and Postpartum booklet. Activity: Advance as tolerated. Pelvic rest for 6 weeks.  Diet: routine diet Anticipated Birth Control: Unsure Postpartum Appointment:1 week Additional Postpartum F/U: Incision check 1 week Future Appointments:No future appointments. Follow up Visit:  Follow-up Information    Christophe Louis, MD. Schedule an appointment as soon as possible for a visit in 1 week(s).   Specialty: Obstetrics and Gynecology Why: Please make an appointment in 1 week to have prevena removed and for an incision check.  Contact information: [address] [place] [address] [place] [address] [phone]                   08/06/2019 Christophe Louis, MD

## 2019-08-06 NOTE — Lactation Note (Signed)
This note was copied from a baby's chart. Lactation Consultation Note F/u w/mom to see how she was doing. Mom is pumping stating she still isn't getting anything. Baby on bili lights.  Patient Name: Melissa Spears [id number]'[id number] Date: 08/06/2019     Maternal Data    Feeding Feeding Type: Formula Nipple Type: Slow - flow  LATCH Score                   Interventions    Lactation Tools Discussed/Used     Consult Status      Saba Neuman G 08/06/2019, 2:55 AM

## 2019-08-06 NOTE — Lactation Note (Signed)
This note was copied from a baby's chart. Lactation Consultation Note  Patient Name: Melissa Spears [id number] Date: 08/06/2019 Reason for consult: Follow-up assessment;1st time breastfeeding;Primapara;Early term 37-38.6wks  1008 - 1016 - I followed up with Ms. Caterino prior to discharge. Her plan is to exclusively pump and bottle feed. She states that she is currently formula feeding while she is awaiting her milk volume to increase.  I reviewed best practices for pumping and recommended that she pump 8 times a day including at night (which I emphasized). Ms. Piotrowski states that she has two breast pumps at home: a spectra S2 and a Baby Buddha pump.   From discussion with Ms. Bethune, it appeared that she had been doing some research on pumping and how to increase production. She states that she has lactation tea and cookies at home.  She is following up with Dr. Karie Mainland.  I reviewed the breast feeding support resources and encouraged her to come in for an OP consult if there were any concerns regarding her milk production or pumping. I also encouraged her to attend the support groups. I also educated on how to manage engorgement.   All questions answered at this time.   Maternal Data Formula Feeding for Exclusion: Yes Reason for exclusion:  (formula feeding until milk volume increases) Does the patient have breastfeeding experience prior to this delivery?: No  Feeding Feeding Type: Bottle Fed - Formula Nipple Type: Slow - flow  Interventions Interventions: Breast feeding basics reviewed  Lactation Tools Discussed/Used Pump Review: Setup, frequency, and cleaning   Consult Status Consult Status: Complete Date: 08/06/19 Follow-up type: In-patient    Lenore Manner 08/06/2019, 10:52 AM

## 2019-12-23 ENCOUNTER — Other Ambulatory Visit: Payer: Self-pay | Admitting: Endocrinology

## 2019-12-23 DIAGNOSIS — E221 Hyperprolactinemia: Secondary | ICD-10-CM

## 2019-12-23 DIAGNOSIS — D352 Benign neoplasm of pituitary gland: Secondary | ICD-10-CM

## 2020-10-10 ENCOUNTER — Emergency Department (HOSPITAL_BASED_OUTPATIENT_CLINIC_OR_DEPARTMENT_OTHER)
Admission: EM | Admit: 2020-10-10 | Discharge: 2020-10-11 | Disposition: A | Discharge status: Home / Self Care | Payer: Managed Care, Other (non HMO) | Attending: Emergency Medicine | Admitting: Emergency Medicine

## 2020-10-10 ENCOUNTER — Other Ambulatory Visit: Payer: Self-pay

## 2020-10-10 ENCOUNTER — Emergency Department (HOSPITAL_BASED_OUTPATIENT_CLINIC_OR_DEPARTMENT_OTHER): Payer: Managed Care, Other (non HMO)

## 2020-10-10 ENCOUNTER — Encounter (HOSPITAL_BASED_OUTPATIENT_CLINIC_OR_DEPARTMENT_OTHER): Payer: Self-pay

## 2020-10-10 DIAGNOSIS — R110 Nausea: Secondary | ICD-10-CM | POA: Insufficient documentation

## 2020-10-10 DIAGNOSIS — R1031 Right lower quadrant pain: Secondary | ICD-10-CM | POA: Diagnosis not present

## 2020-10-10 DIAGNOSIS — R109 Unspecified abdominal pain: Secondary | ICD-10-CM

## 2020-10-10 DIAGNOSIS — Z86018 Personal history of other benign neoplasm: Secondary | ICD-10-CM | POA: Diagnosis not present

## 2020-10-10 LAB — CBC WITH DIFFERENTIAL/PLATELET
Abs Immature Granulocytes: 0.01 10*3/uL (ref 0.00–0.07)
Basophils Absolute: 0 10*3/uL (ref 0.0–0.1)
Basophils Relative: 0 %
Eosinophils Absolute: 0.1 10*3/uL (ref 0.0–0.5)
Eosinophils Relative: 2 %
HCT: 40.7 % (ref 36.0–46.0)
Hemoglobin: 13.4 g/dL (ref 12.0–15.0)
Immature Granulocytes: 0 %
Lymphocytes Relative: 46 %
Lymphs Abs: 3.2 10*3/uL (ref 0.7–4.0)
MCH: 28.8 pg (ref 26.0–34.0)
MCHC: 32.9 g/dL (ref 30.0–36.0)
MCV: 87.3 fL (ref 80.0–100.0)
Monocytes Absolute: 0.6 10*3/uL (ref 0.1–1.0)
Monocytes Relative: 9 %
Neutro Abs: 2.9 10*3/uL (ref 1.7–7.7)
Neutrophils Relative %: 43 %
Platelets: 321 10*3/uL (ref 150–400)
RBC: 4.66 MIL/uL (ref 3.87–5.11)
RDW: 13.2 % (ref 11.5–15.5)
WBC: 6.9 10*3/uL (ref 4.0–10.5)
nRBC: 0 % (ref 0.0–0.2)

## 2020-10-10 LAB — BASIC METABOLIC PANEL
Anion gap: 9 (ref 5–15)
BUN: 10 mg/dL (ref 6–20)
CO2: 27 mmol/L (ref 22–32)
Calcium: 9 mg/dL (ref 8.9–10.3)
Chloride: 102 mmol/L (ref 98–111)
Creatinine, Ser: 0.73 mg/dL (ref 0.44–1.00)
GFR, Estimated: >60 mL/min (ref >60)
Glucose, Bld: 84 mg/dL (ref 70–99)
Potassium: 3.9 mmol/L (ref 3.5–5.1)
Sodium: 138 mmol/L (ref 135–145)

## 2020-10-10 LAB — URINALYSIS, ROUTINE W REFLEX MICROSCOPIC
Bilirubin Urine: NEGATIVE
Glucose, UA: NEGATIVE mg/dL
Hgb urine dipstick: NEGATIVE
Ketones, ur: NEGATIVE mg/dL
Leukocytes,Ua: NEGATIVE
Nitrite: NEGATIVE
Protein, ur: NEGATIVE mg/dL
Specific Gravity, Urine: 1.03 (ref 1.005–1.030)
pH: 6 (ref 5.0–8.0)

## 2020-10-10 LAB — PREGNANCY, URINE: Preg Test, Ur: NEGATIVE

## 2020-10-10 NOTE — Discharge Instructions (Signed)
Work-up without any evidence of any kidney stones or appendix and also ovary and female organs appeared normal.  Work note provided.  Recommend taking Motrin or Naprosyn Advil or Aleve for the pain.  If things do not resolve in the next few days would make an appointment to follow-up with your GYN doctor.  Also can follow-up with your primary care doctor.  Return for any new or worse symptoms.

## 2020-10-10 NOTE — ED Triage Notes (Signed)
Pt c/o right abdominal pain and flank pain starting today. Became lightheaded and nauseous. Denies urinary symptoms.

## 2020-10-10 NOTE — ED Provider Notes (Addendum)
[facility] Provider Note   CSN: [id number] Arrival date & time: 10/10/20  1831     History Chief Complaint  Patient presents with   Flank Pain    Melissa Spears is a 40 y.o. female.  Patient with the onset of right flank pain right lower quadrant abdominal pain about 8 in the morning.  The pain has been intermittent.  Worse with walking.  Associated with some nausea but no vomiting.  Urinalysis was negative pregnancy test was negative.  No history of kidney stones.  No history of similar pain.      Past Medical History:  Diagnosis Date   Amenorrhea 2000   Unknown cause   Pituitary adenoma ([place])    Prolactinoma ([place])    Varicose veins     Patient Active Problem List   Diagnosis Date Noted   PROM (premature rupture of membranes) 08/03/2019   Abnormal maternal serum screening test    [redacted] weeks gestation of pregnancy    Advanced maternal age in multigravida, second trimester    Black-out (not amnesia) 06/15/2014    Past Surgical History:  Procedure Laterality Date   CESAREAN SECTION N/A 08/03/2019   Procedure: CESAREAN SECTION;  Surgeon: Christophe Louis, MD;  Location: [facility];  Service: Obstetrics;  Laterality: N/A;   TONSILLECTOMY AND ADENOIDECTOMY     TONSILLECTOMY AND ADENOIDECTOMY       OB History     Gravida  1   Para  1   Term  1   Preterm      AB      Living  1      SAB      IAB      Ectopic      Multiple  0   Live Births  1           Family History  Problem Relation Age of Onset   Diabetes Mother    Hypertension Mother    Kidney disease Mother    Heart failure Brother    Hypotension Brother    Heart murmur Brother    Hypertension Maternal Grandmother    Diabetes Maternal Grandmother    Kidney disease Maternal Grandmother    Heart attack Maternal Grandfather     Social History   Tobacco Use   Smoking status: Never   Smokeless tobacco: Never  Vaping Use   Vaping Use: Never used  Substance  Use Topics   Alcohol use: No    Alcohol/week: 0.0 standard drinks   Drug use: No    Home Medications Prior to Admission medications   Medication Sig Start Date End Date Taking? Authorizing Provider  Acetaminophen (TYLENOL PO) Take by mouth.    [provider]  ibuprofen (ADVIL) 800 MG tablet Take 1 tablet (800 mg total) by mouth every 8 (eight) hours as needed. 08/06/19   Christophe Louis, MD  Prenatal Vit-Fe Fumarate-FA (PRENATAL VITAMIN PO) Take by mouth.    [provider]  tizanidine (ZANAFLEX) 6 MG capsule 1 tab po q hs Patient not taking: Reported on 04/13/2019 11/10/14   Saguier, Percell Miller, PA-C    Allergies    Patient has no known allergies.  Review of Systems   Review of Systems  Constitutional:  Negative for chills and fever.  HENT:  Negative for ear pain and sore throat.   Eyes:  Negative for pain and visual disturbance.  Respiratory:  Negative for cough and shortness of breath.   Cardiovascular:  Negative  for chest pain and palpitations.  Gastrointestinal:  Positive for abdominal pain and nausea. Negative for vomiting.  Genitourinary:  Positive for flank pain. Negative for dysuria and hematuria.  Musculoskeletal:  Negative for arthralgias and back pain.  Skin:  Negative for color change and rash.  Neurological:  Negative for seizures and syncope.  All other systems reviewed and are negative.  Physical Exam Updated Vital Signs BP 127/86 (BP Location: Left Arm)   Pulse 72   Temp 98.7 F (37.1 C) (Oral)   Resp 19   Ht 1.473 m ('4\' 10"'$ )   Wt 83.9 kg   LMP  (LMP Unknown)   SpO2 99%   BMI 38.67 kg/m   Physical Exam Vitals and nursing note reviewed.  Constitutional:      General: She is not in acute distress.    Appearance: Normal appearance. She is well-developed.  HENT:     Head: Normocephalic and atraumatic.  Eyes:     Extraocular Movements: Extraocular movements intact.     Conjunctiva/sclera: Conjunctivae normal.     Pupils: Pupils are equal,  round, and reactive to light.  Cardiovascular:     Rate and Rhythm: Normal rate and regular rhythm.     Heart sounds: No murmur heard. Pulmonary:     Effort: Pulmonary effort is normal. No respiratory distress.     Breath sounds: Normal breath sounds.  Abdominal:     Palpations: Abdomen is soft.     Tenderness: There is abdominal tenderness. There is no guarding.     Comments: May be some slight tenderness right lower quadrant.  No guarding.  Musculoskeletal:     Cervical back: Neck supple.  Skin:    General: Skin is warm and dry.  Neurological:     General: No focal deficit present.     Mental Status: She is alert and oriented to person, place, and time.     Cranial Nerves: No cranial nerve deficit.     Sensory: No sensory deficit.    ED Results / Procedures / Treatments   Labs (all labs ordered are listed, but only abnormal results are displayed) Labs Reviewed  URINALYSIS, ROUTINE W REFLEX MICROSCOPIC - Abnormal; Notable for the following components:      Result Value   APPearance CLOUDY (*)    All other components within normal limits  PREGNANCY, URINE  BASIC METABOLIC PANEL  CBC WITH DIFFERENTIAL/PLATELET    EKG None  Radiology CT Renal Stone Study  Result Date: 10/10/2020 CLINICAL DATA:  Right flank pain EXAM: CT ABDOMEN AND PELVIS WITHOUT CONTRAST TECHNIQUE: Multidetector CT imaging of the abdomen and pelvis was performed following the standard protocol without IV contrast. COMPARISON:  None. FINDINGS: Lower chest: No acute abnormality. Hepatobiliary: No focal hepatic abnormality. Gallbladder unremarkable. Pancreas: No focal abnormality or ductal dilatation. Spleen: No focal abnormality.  Normal size. Adrenals/Urinary Tract: No adrenal abnormality. No focal renal abnormality. No stones or hydronephrosis. Urinary bladder is unremarkable. Stomach/Bowel: Normal appendix. Few scattered right colonic diverticula. Stomach and small bowel decompressed, unremarkable.  Vascular/Lymphatic: No evidence of aneurysm or adenopathy. Reproductive: Uterus and adnexa unremarkable.  No mass. Other: No free fluid or free air. Musculoskeletal: No acute bony abnormality. IMPRESSION: No renal or ureteral stones.  No hydronephrosis. No acute findings in the abdomen or pelvis. Electronically Signed   By: Rolm Baptise M.D.   On: 10/10/2020 22:16    Procedures Procedures   Medications Ordered in ED Medications - No data to display  ED Course  I  have reviewed the triage vital signs and the nursing notes.  Pertinent labs & imaging results that were available during my care of the patient were reviewed by me and considered in my medical decision making (see chart for details).    MDM Rules/Calculators/A&P                           Based on the fact that the pain is intermittent.  Less concern for acute appendicitis.  More concern for possible renal calculus.  Ureteral calculus.  We will go ahead and get CT renal study.  CBC and basic metabolic panel.  Patient's urinalysis and pregnancy test negative.  Patient in no acute distress.  Work-up urinalysis negative no leukocytosis basic metabolic panel normal pregnancy test negative.  CT scan of the abdomen shows no problems appendicitis no kidney stones no abnormalities with the reproductive organs.  No explanation for the pain.  Patient nontoxic no acute distress.  Will have patient take Naprosyn or Motrin and follow-up with her GYN doctor and/or her primary care doctor.  Final Clinical Impression(s) / ED Diagnoses Final diagnoses:  Right flank pain  Right lower quadrant abdominal pain    Rx / DC Orders ED Discharge Orders     None        Fredia Sorrow, MD 10/10/20 2153    Fredia Sorrow, MD 10/10/20 [phone]

## 2020-12-04 IMAGING — US US MFM OB DETAIL+14 WK
2 series · 16 of 28 positions shown · non-contrast
Comparison: none

[Series 1: us mfm ob detail+14 wk · 15 of 113 slices shown (1 of 2)]
[im 1/113]
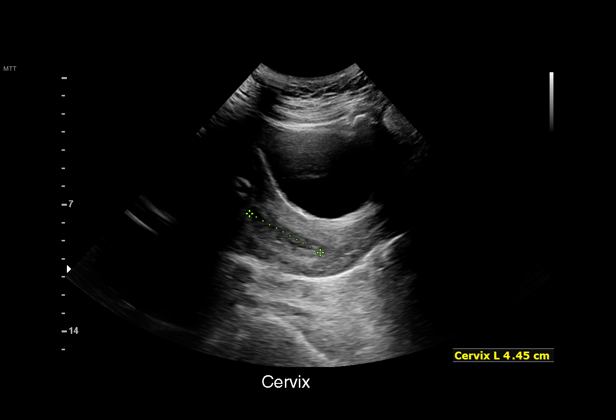
[im 9/113]
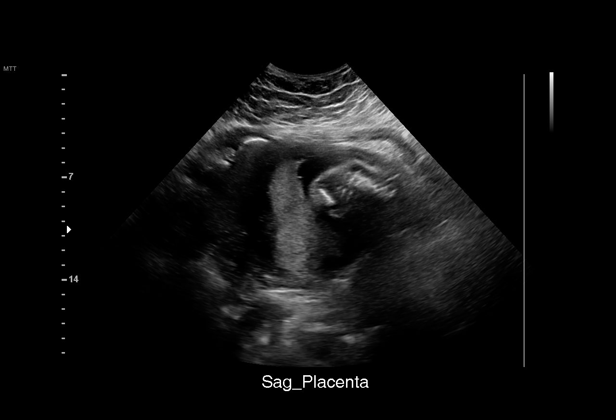
[im 18/113]
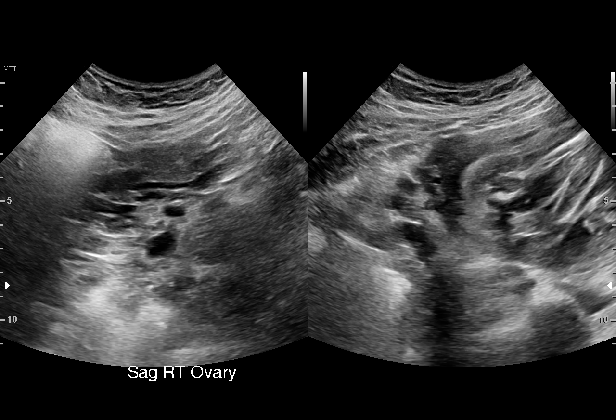
[im 27/113]
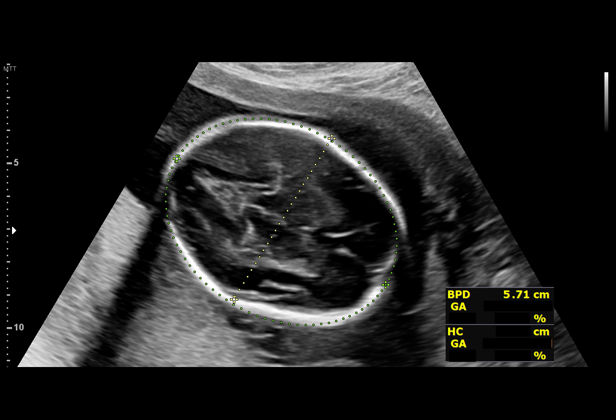
[im 32/113]
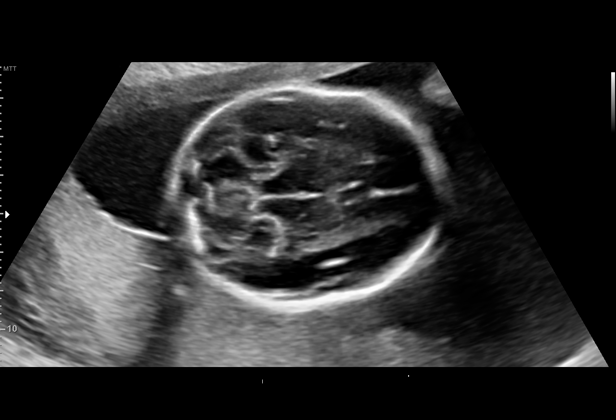
[im 41/113]
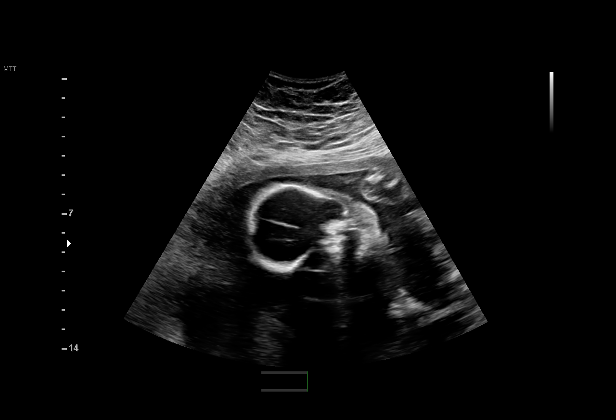
[im 50/113]
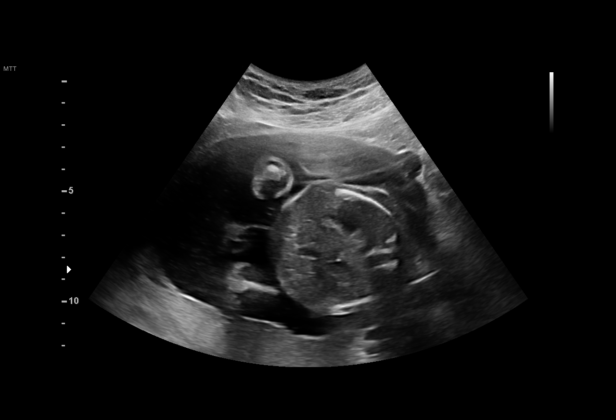
[im 59/113]
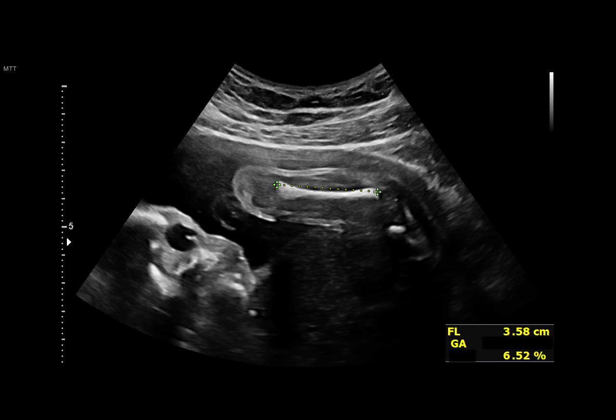
[im 63/113]
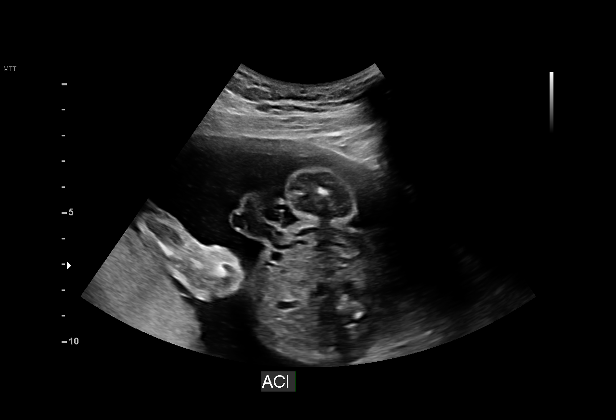
[im 72/113]
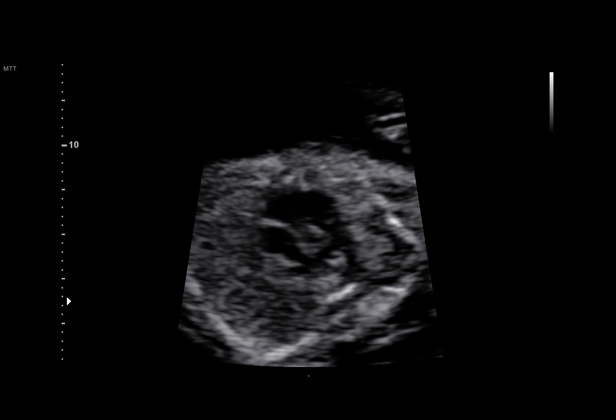
[im 81/113]
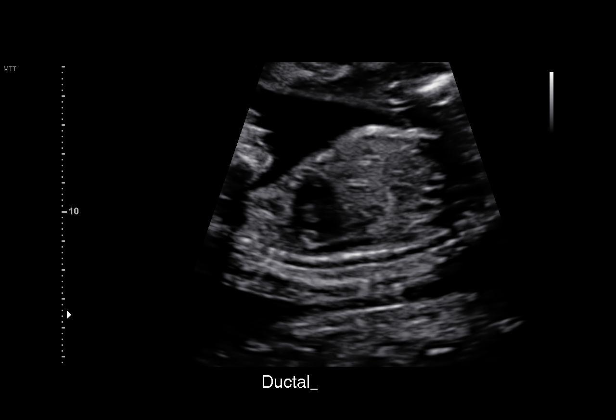
[im 90/113]
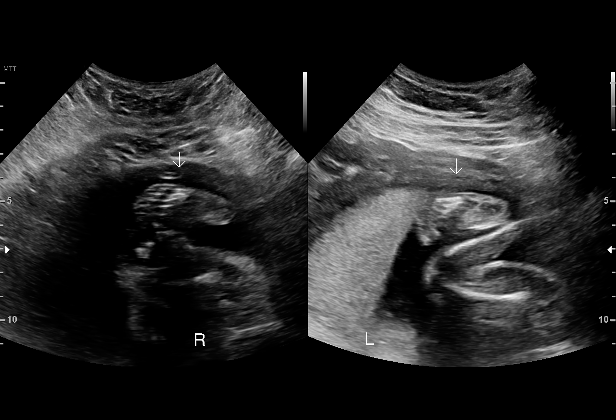
[im 95/113]
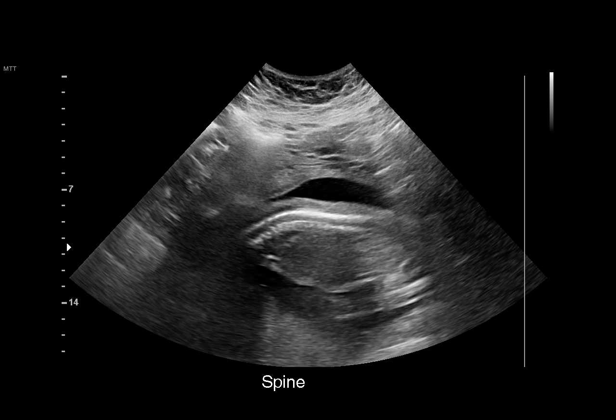
[im 104/113]
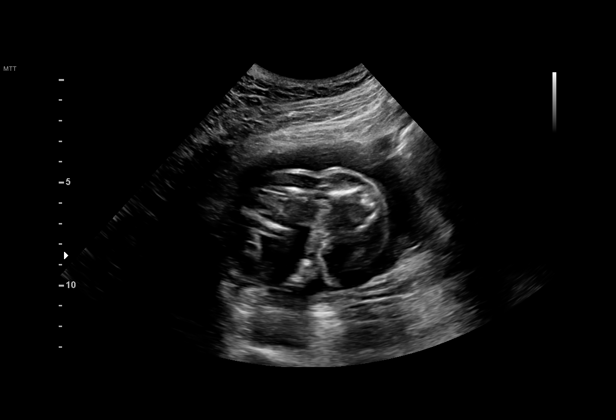
[im 113/113]
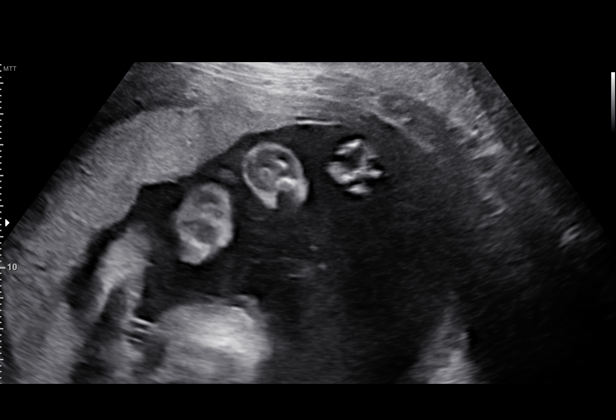

[Series 3: us mfm ob detail+14 wk · 10 acquisitions, 1 frame shown (2 of 2)]
[im 10/10]
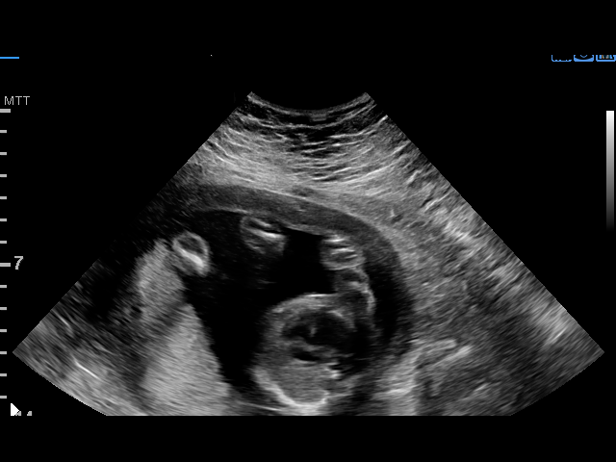

[16 of 28 positions shown; findings below may reference images not displayed]

[REDACTED]
                                                            Ave., [HOSPITAL]

  2  US MFM AMNIOCENTESIS                 [id number]     BLONDINACKA SAMSONA[place]
 ----------------------------------------------------------------------

 ----------------------------------------------------------------------
Indications

  Abnormal biochemical screen (quad) for
  Trisomy 18
  Advanced maternal age primigravida 36+,
  second trimester
  22 weeks gestation of pregnancy
  Encounter for antenatal screening for
  malformations
  Obesity complicating pregnancy, second
  trimester
 ----------------------------------------------------------------------
Fetal Evaluation

 Num Of Fetuses:         1
 Fetal Heart Rate(bpm):  158
 Cardiac Activity:       Observed
 Presentation:           Breech
 Placenta:               Posterior
 P. Cord Insertion:      Visualized, central

 Amniotic Fluid
 AFI FV:      Within normal limits

                             Largest Pocket(cm)

Biometry

 BPD:        56  mm     G. Age:  23w 1d         61  %    CI:        72.97   %    70 - 86
                                                         FL/HC:      17.3   %    19.2 -
 HC:      208.4  mm     G. Age:  23w 0d         45  %    HC/AC:      1.08        1.05 -
 AC:      193.7  mm     G. Age:  24w 1d         82  %    FL/BPD:     64.3   %    71 - 87
 FL:         36  mm     G. Age:  21w 3d          7  %    FL/AC:      18.6   %    20 - 24
 HUM:        37  mm     G. Age:  22w 6d         49  %
 CER:      26.6  mm     G. Age:  24w 2d         78  %
 LV:        4.9  mm
 CM:        3.4  mm

 Est. FW:     547  gm      1 lb 3 oz     54  %
OB History

 Blood Type:    A+
 Gravidity:    1         Term:   0        Prem:   0        SAB:   0
 TOP:          0       Ectopic:  0        Living: 0
Gestational Age

 Clinical EDD:  22w 5d                                        EDD:   08/12/19
 U/S Today:     23w 0d                                        EDD:   08/10/19
 Best:          22w 5d     Det. By:  Clinical EDD             EDD:   08/12/19
Anatomy

 Cranium:               Appears normal         Aortic Arch:            Appears normal
 Cavum:                 Appears normal         Ductal Arch:            Appears normal
 Ventricles:            Appears normal         Diaphragm:              Appears normal
 Choroid Plexus:        Appears normal         Stomach:                Appears normal, left
                                                                       sided
 Cerebellum:            Appears normal         Abdomen:                Appears normal
 Posterior Fossa:       Appears normal         Abdominal Wall:         Appears nml (cord
                                                                       insert, abd wall)
 Nuchal Fold:           Appears normal         Cord Vessels:           Appears normal (3
                                                                       vessel cord)
 Face:                  Appears normal         Kidneys:                Appear normal
                        (orbits and profile)
 Lips:                  Appears normal         Bladder:                Appears normal
 Thoracic:              Appears normal         Spine:                  Limited views
                                                                       appear normal
 Heart:                 Appears normal         Upper Extremities:      Appears normal
                        (4CH, axis, and
                        situs)
 RVOT:                  Appears normal         Lower Extremities:      Appears normal
 LVOT:                  Appears normal

 Other:  Male gender. Heels visualized. Nasal bone visualized. Technically
         difficult due to maternal habitus and fetal position.
Guided Procedures

 Type:   Amniocentesis for fetal genetic study

 FH Post Procedure:     Normal             RH Type:          A+
 Rh Immune Globulin:    Not required,      Discharge Inst.:  Post-procedure
                        Rh positive                          instructions
                                                             given
 Needle Insertions:     20 gauge x 1       Vol. Withdrawn:   30 ml of clear
                                                             amniotic fluid
 Catheter Passes:       1 pass             mg Withdrawn:     Adequate
                                                             sample of villi
 Transabdominal:        Yes
 Complications:  None
 Comment:                    Post amnio heart rate 164
Cervix Uterus Adnexa

 Cervix
 Length:           4.45  cm.
 Normal appearance by transabdominal scan.

 Uterus
 No abnormality visualized.

 Left Ovary
 Within normal limits. No adnexal mass visualized.

 Right Ovary
 Within normal limits. No adnexal mass visualized.

 Cul De Sac
 No free fluid seen.

 Adnexa
 No abnormality visualized.
Comments

 This patient was seen for a detailed fetal anatomy scan due
 to advanced maternal age, maternal obesity, and as her cell
 free DNA test indicated an increased risk for trisomy 18.  The
 patient reports a history of a prolactinoma.  She had been
 treated with cabergoline and bromocriptine outside of
 pregnancy.  She reports that she is followed by an
 endocrinologist for the management of her prolactinoma and
 is undergoing frequent visual field testing.  She denies any
 other significant past medical history and denies any
 problems in her current pregnancy.
 Her cell free DNA test indicated a low risk for Down syndrome
 and trisomy 13.  A male fetus is predicted.
 She was informed that the fetal growth and amniotic fluid
 level were appropriate for her gestational age.
 There were no obvious fetal anomalies noted on today's
 ultrasound exam.  However, today's exam was limited by the
 maternal body habitus and the fetal position.
 The patient was informed that anomalies may be missed due
 to technical limitations. If the fetus is in a suboptimal position
 or maternal habitus is increased, visualization of the fetus in
 the maternal uterus may be impaired.
 The patient has received extensive counseling from both
 myself and the genetic counselor regarding the risks versus
 benefits of the amniocentesis procedure.  She has stated that
 she wants the procedure done today for definitive diagnosis.
 After informed consent was obtained, a timeout was
 performed verifying the patient's identity and the indications
 for the procedure.  The patient was then prepped and draped
 in the usual sterile fashion.
 An uncomplicated genetic amniocentesis was performed
 today obtaining 30 cc of straw-colored amniotic fluid which
 was then sent off for amniotic fluid AFP, FISH, and
 chromosome analysis.  The patient was advised that our
 genetic counselor will notify her regarding the results of the
 amniocentesis.  Post amniocentesis instructions were
 discussed.  As the patient's blood type is Rh positive, a dose
 of RhoGam was not given following the procedure.
 A follow-up exam was scheduled in 4 weeks to complete the
 views of the fetal anatomy.

## 2021-05-23 DIAGNOSIS — Z86018 Personal history of other benign neoplasm: Secondary | ICD-10-CM | POA: Insufficient documentation

## 2021-05-23 DIAGNOSIS — G8929 Other chronic pain: Secondary | ICD-10-CM | POA: Insufficient documentation

## 2021-05-23 DIAGNOSIS — I10 Essential (primary) hypertension: Secondary | ICD-10-CM | POA: Insufficient documentation

## 2021-07-14 ENCOUNTER — Ambulatory Visit (INDEPENDENT_AMBULATORY_CARE_PROVIDER_SITE_OTHER): Payer: Managed Care, Other (non HMO) | Admitting: *Deleted

## 2021-07-14 VITALS — BP 151/98 | HR 76 | Ht 59.0 in | Wt 210.7 lb

## 2021-07-14 DIAGNOSIS — I10 Essential (primary) hypertension: Secondary | ICD-10-CM

## 2021-07-14 DIAGNOSIS — Z3687 Encounter for antenatal screening for uncertain dates: Secondary | ICD-10-CM

## 2021-07-14 DIAGNOSIS — Z3201 Encounter for pregnancy test, result positive: Secondary | ICD-10-CM

## 2021-07-14 DIAGNOSIS — O169 Unspecified maternal hypertension, unspecified trimester: Secondary | ICD-10-CM | POA: Insufficient documentation

## 2021-07-14 DIAGNOSIS — Z3200 Encounter for pregnancy test, result unknown: Secondary | ICD-10-CM

## 2021-07-14 DIAGNOSIS — R634 Abnormal weight loss: Secondary | ICD-10-CM

## 2021-07-14 DIAGNOSIS — O0990 Supervision of high risk pregnancy, unspecified, unspecified trimester: Secondary | ICD-10-CM | POA: Insufficient documentation

## 2021-07-14 LAB — POCT PREGNANCY, URINE: Preg Test, Ur: POSITIVE — AB

## 2021-07-14 MED ORDER — PRENATAL VITAMIN 27-0.8 MG PO TABS: 1.0000 | ORAL_TABLET | Freq: Every day | ORAL | 11 refills | Status: DC

## 2021-07-14 MED ORDER — NIFEDIPINE ER OSMOTIC RELEASE 30 MG PO TB24: 30.0000 mg | ORAL_TABLET | Freq: Every day | ORAL | 8 refills | Status: AC

## 2021-07-14 NOTE — Patient Instructions (Signed)
Prenatal Care Providers           [facility]  [address] [phone]  [facility]   [address]  [phone]  [facility] [place] [facility]       [address] [phone]            [facility] @ [place]     [address] [phone]          [facility] [place]   [address] [phone]  [facility]  [address] [phone]     [facility])  [address]   [phone]     [facility]  Phone: [phone]  [facility]  Phone: [phone]  [facility] Phone: [phone]  [facility] Phone: [phone]  [facility] Phone: [phone]  [facility] Phone: [phone]  [facility]  Phone: [phone]  

## 2021-07-14 NOTE — Progress Notes (Signed)
Here for nurse visit for pregnancy test which was positive. Reports LMP 06/14/21 but states it was lighter than normal . She would like to get prenatal care with here. She does have CHTN.  She was on Lisinopril but stopped on Monday when she found out pregnant . She also was on Ozempic and stopped it when found out pregnant. Jamilla in to see patient and ordered procardia for [facility]. She also ordered dating [place].  All meds reviewed. List of providers placed in AVS. Instructed to start prenatal vitamins. Is taking OTC PNV but requested RX. RX sent.  Staci Acosta

## 2021-07-18 ENCOUNTER — Ambulatory Visit (INDEPENDENT_AMBULATORY_CARE_PROVIDER_SITE_OTHER): Payer: Managed Care, Other (non HMO)

## 2021-07-18 ENCOUNTER — Inpatient Hospital Stay (HOSPITAL_COMMUNITY)
Admission: AD | Admit: 2021-07-18 | Discharge: 2021-07-18 | Disposition: A | Discharge status: Home / Self Care | Payer: Managed Care, Other (non HMO) | Attending: Obstetrics & Gynecology | Admitting: Obstetrics & Gynecology

## 2021-07-18 ENCOUNTER — Telehealth: Payer: Self-pay

## 2021-07-18 ENCOUNTER — Ambulatory Visit (INDEPENDENT_AMBULATORY_CARE_PROVIDER_SITE_OTHER): Payer: Managed Care, Other (non HMO) | Admitting: Student

## 2021-07-18 ENCOUNTER — Other Ambulatory Visit: Payer: Self-pay

## 2021-07-18 ENCOUNTER — Encounter (HOSPITAL_COMMUNITY): Payer: Self-pay | Admitting: Obstetrics & Gynecology

## 2021-07-18 ENCOUNTER — Other Ambulatory Visit: Payer: Managed Care, Other (non HMO)

## 2021-07-18 ENCOUNTER — Other Ambulatory Visit: Payer: Self-pay | Admitting: Family Medicine

## 2021-07-18 DIAGNOSIS — O3680X0 Pregnancy with inconclusive fetal viability, not applicable or unspecified: Secondary | ICD-10-CM | POA: Diagnosis not present

## 2021-07-18 DIAGNOSIS — Z3687 Encounter for antenatal screening for uncertain dates: Secondary | ICD-10-CM

## 2021-07-18 DIAGNOSIS — O00202 Left ovarian pregnancy without intrauterine pregnancy: Secondary | ICD-10-CM | POA: Diagnosis present

## 2021-07-18 LAB — COMPREHENSIVE METABOLIC PANEL
ALT: 15 U/L (ref 0–44)
AST: 16 U/L (ref 15–41)
Albumin: 3.5 g/dL (ref 3.5–5.0)
Alkaline Phosphatase: 59 U/L (ref 38–126)
Anion gap: 7 (ref 5–15)
BUN: 15 mg/dL (ref 6–20)
CO2: 27 mmol/L (ref 22–32)
Calcium: 9.6 mg/dL (ref 8.9–10.3)
Chloride: 107 mmol/L (ref 98–111)
Creatinine, Ser: 0.73 mg/dL (ref 0.44–1.00)
GFR, Estimated: >60 mL/min (ref >60)
Glucose, Bld: 97 mg/dL (ref 70–99)
Potassium: 4 mmol/L (ref 3.5–5.1)
Sodium: 141 mmol/L (ref 135–145)
Total Bilirubin: 0.3 mg/dL (ref 0.3–1.2)
Total Protein: 7.1 g/dL (ref 6.5–8.1)

## 2021-07-18 LAB — CBC
HCT: 39.8 % (ref 36.0–46.0)
Hemoglobin: 13.3 g/dL (ref 12.0–15.0)
MCH: 29.2 pg (ref 26.0–34.0)
MCHC: 33.4 g/dL (ref 30.0–36.0)
MCV: 87.3 fL (ref 80.0–100.0)
Platelets: 331 10*3/uL (ref 150–400)
RBC: 4.56 MIL/uL (ref 3.87–5.11)
RDW: 13.2 % (ref 11.5–15.5)
WBC: 5.8 10*3/uL (ref 4.0–10.5)
nRBC: 0 % (ref 0.0–0.2)

## 2021-07-18 LAB — BETA HCG QUANT (REF LAB): hCG Quant: 4719 m[IU]/mL

## 2021-07-18 MED ORDER — ACETAMINOPHEN 500 MG PO TABS: 1000.0000 mg | ORAL_TABLET | Freq: Four times a day (QID) | ORAL | 0 refills | Status: DC | PRN

## 2021-07-18 MED ORDER — METHOTREXATE FOR ECTOPIC PREGNANCY
50.0000 mg/m2 | Freq: Once | INTRAMUSCULAR | Status: AC
  Administered 2021-07-18: 100 mg via INTRAMUSCULAR
  Filled 2021-07-18: qty 10

## 2021-07-18 NOTE — Telephone Encounter (Signed)
HCG result is 4719. Reviewed with Dione Plover, MD who recommends patient be seen immediately at MAU for treatment of ectopic pregnancy. Called pt; VM left. MyChart message sent. Pt calls front office and is told to check MyChart message and report to MAU.

## 2021-07-18 NOTE — MAU Note (Signed)
Melissa Spears is a 41 y.o. at Unknown here in MAU reporting: she was called and instructed to be seen in MAU secondary ectopic pregnancy.  Reports was seen today, had HCG level and ultrasound done, nothing seen in the uterus.  States was initially instructed to f/u in 2 days with repeat quant but after lab results called and told to be seen in MAU today. LMP: 06/08/2021 Onset of complaint: today Pain score: 0 Vitals:   07/18/21 1626  BP: 139/77  Pulse: 88  Resp: 18  Temp: 98.3 F (36.8 C)  SpO2: 97%     FHT:N/A Lab orders placed from triage:   None

## 2021-07-18 NOTE — Progress Notes (Signed)
   GYNECOLOGY OFFICE VISIT NOTE  History:   Melissa Spears is a 41 y.o. G2P1001 here today for ultrasound results.  Ultrasound shows no IUP, left ovarian cyst.  Abdominal pain: No   Vaginal bleeding: Yes  - some bleeding over the weekend, currently spotting.  Health Maintenance Due  Topic Date Due   COVID-19 Vaccine (1) Never done   Hepatitis C Screening  Never done   PAP SMEAR-Modifier  06/14/2017    Past Medical History:  Diagnosis Date   Amenorrhea 2000   Unknown cause   Pituitary adenoma ([place])    Prolactinoma ([place])    Varicose veins     Past Surgical History:  Procedure Laterality Date   CESAREAN SECTION N/A 08/03/2019   Procedure: CESAREAN SECTION;  Surgeon: Christophe Louis, MD;  Location: [place] [facility] ORS;  Service: Obstetrics;  Laterality: N/A;   TONSILLECTOMY AND ADENOIDECTOMY     TONSILLECTOMY AND ADENOIDECTOMY      The following portions of the patient's history were reviewed and updated as appropriate: allergies, current medications, past family history, past medical history, past social history, past surgical history and problem list.   Health Maintenance:   Last pap: 03/06/2019 - normal   Review of Systems:  Pertinent items noted in HPI and remainder of comprehensive ROS otherwise negative.  Physical Exam:  LMP 06/14/2021  CONSTITUTIONAL: Well-developed, well-nourished female in no acute distress.  HEENT:  Normocephalic, atraumatic. External right and left ear normal. No scleral icterus.  NECK: Normal range of motion, supple, no masses noted on observation SKIN: No rash noted. Not diaphoretic. No erythema. No pallor. MUSCULOSKELETAL: Normal range of motion. No edema noted. NEUROLOGIC: Alert and oriented to person, place, and time. Normal muscle tone coordination.  PSYCHIATRIC: Normal mood and affect. Normal behavior. Normal judgment and thought content. RESPIRATORY: Effort normal, no problems with respiration noted  Labs and Imaging Results for orders placed  or performed in visit on 07/14/21 (from the past 168 hour(s))  Pregnancy, urine POC   Collection Time: 07/14/21  9:22 AM  Result Value Ref Range   Preg Test, Ur POSITIVE (A) NEGATIVE   No results found.    Assessment and Plan:  Pregnancy with uncertain viability - 381.01B5  Stat HCG today & in 2 days.   Please refer to After Visit Summary for other counseling recommendations.   Images reviewed by Dr. Kennon Rounds who spoke with patient about plan.  Stat HCG drawn today & will collect next HCG in 2 days.  I personally discussed ectopic precautions with patient & reasons to present to MAU.  She is RH positive.    Jorje Guild, NP [facility], [place]

## 2021-07-18 NOTE — MAU Provider Note (Cosign Needed Addendum)
Chief Complaint: Other  Event Date/Time    First Provider Initiated Contact with Patient 07/18/21 1748      SUBJECTIVE HPI: Melissa Spears is a 41 y.o. G2P1001 at Unknown who was sent to [facility] for Tx of Left ectopic pregnancy incidentally found on dating/viability [place] today. Dr. Nelda Marseille reviewed [place] images and confirmed Dx ectopic pregnancy. hCG 4700. Nothing in the uterus. Left adnexal mass < 4 cm, + fetal pole. No cardiac activity.   Vaginal Bleeding: Denies Passage of tissue or clots: Denies Abd Pain: Denies  Conflict (See Lab Report): A POS/A POS Performed at [facility], [place] [address], [place],  [address]  Associated signs and symptoms: None  Past Medical History:  Diagnosis Date   Amenorrhea 2000   Unknown cause   Pituitary adenoma ([place])    Prolactinoma ([place])    Varicose veins    OB History  Gravida Para Term Preterm AB Living  '2 1 1     1  '$ SAB IAB Ectopic Multiple Live Births        0 1    # Outcome Date GA Lbr Len/2nd Weight Sex Delivery Anes PTL Lv  2 Current           1 Term 08/03/19 [redacted]w[redacted]d 3510 g M CS-LTranv EPI  LIV     Birth Comments: moulding/caput   Past Surgical History:  Procedure Laterality Date   CESAREAN SECTION N/A 08/03/2019   Procedure: CESAREAN SECTION;  Surgeon: CChristophe Louis MD;  [facility];  Service: Obstetrics;  Laterality: N/A;   TONSILLECTOMY AND ADENOIDECTOMY     TONSILLECTOMY AND ADENOIDECTOMY     Social History   Socioeconomic History   Marital status: DSoil scientist   Spouse name: Not on file   Number of children: Not on file   Years of education: Not on file   Highest education level: Not on file  Occupational History   Occupation: cSurveyor, quantity HPublic house manager  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[employer] --price changes , visual   Tobacco Use   Smoking status: Never   Smokeless tobacco: Never  Vaping Use   Vaping Use: Never used  Substance and Sexual Activity   Alcohol use:  No    Alcohol/week: 0.0 standard drinks   Drug use: No   Sexual activity: Not on file  Other Topics Concern   Not on file  Social History Narrative   Exercise-- tae bo   Social Determinants of Health   Financial Resource Strain: Not on file  Food Insecurity: Not on file  Transportation Needs: Not on file  Physical Activity: Not on file  Stress: Not on file  Social Connections: Not on file  Intimate Partner Violence: Not on file   No current facility-administered medications on file prior to encounter.   Current Outpatient Medications on File Prior to Encounter  Medication Sig Dispense Refill   lisinopril-hydrochlorothiazide (ZESTORETIC) 10-12.5 MG tablet Take 1 tablet by mouth daily.     NIFEdipine (PROCARDIA XL) 30 MG 24 hr tablet Take 1 tablet (30 mg total) by mouth daily. 30 tablet 8   OZEMPIC, 0.25 OR 0.5 MG/DOSE, 2 MG/3ML SOPN SMARTSIG:0.3 Milliliter(s) SUB-Q Once a Week     Prenatal Vit-Fe Fumarate-FA (PRENATAL VITAMIN) 27-0.8 MG TABS Take 1 tablet by mouth daily. 30 tablet 11   No Known Allergies  I have reviewed the past Medical Hx, Surgical Hx, Social Hx, Allergies and Medications.  Review of Systems  Constitutional:  Negative for chills and fever.  Gastrointestinal:  Negative for abdominal pain.  Genitourinary:  Negative for vaginal bleeding.   OBJECTIVE Patient Vitals for the past 24 hrs:  BP Temp Temp src Pulse Resp SpO2 Height Weight  07/18/21 1626 139/77 98.3 F (36.8 C) Oral 88 18 97 % -- --  07/18/21 1619 -- -- -- -- -- -- '4\' 11"'$  (1.499 m) 95.1 kg   Constitutional: Well-developed, well-nourished female in no acute distress.  Cardiovascular: normal rate Respiratory: normal rate and effort.  GI: Deferred Neurologic: Alert and oriented x 4.  GU: Deferred  LAB RESULTS Results for orders placed or performed during the hospital encounter of 07/18/21 (from the past 24 hour(s))  CBC     Status: None   Collection Time: 07/18/21  5:16 PM  Result Value Ref  Range   WBC 5.8 4.0 - 10.5 K/uL   RBC 4.56 3.87 - 5.11 MIL/uL   Hemoglobin 13.3 12.0 - 15.0 g/dL   HCT 39.8 36.0 - 46.0 %   MCV 87.3 80.0 - 100.0 fL   MCH 29.2 26.0 - 34.0 pg   MCHC 33.4 30.0 - 36.0 g/dL   RDW 13.2 11.5 - 15.5 %   Platelets 331 150 - 400 K/uL   nRBC 0.0 0.0 - 0.2 %  Comprehensive metabolic panel     Status: None   Collection Time: 07/18/21  5:16 PM  Result Value Ref Range   Sodium 141 135 - 145 mmol/L   Potassium 4.0 3.5 - 5.1 mmol/L   Chloride 107 98 - 111 mmol/L   CO2 27 22 - 32 mmol/L   Glucose, Bld 97 70 - 99 mg/dL   BUN 15 6 - 20 mg/dL   Creatinine, Ser 0.73 0.44 - 1.00 mg/dL   Calcium 9.6 8.9 - 10.3 mg/dL   Total Protein 7.1 6.5 - 8.1 g/dL   Albumin 3.5 3.5 - 5.0 g/dL   AST 16 15 - 41 U/L   ALT 15 0 - 44 U/L   Alkaline Phosphatase 59 38 - 126 U/L   Total Bilirubin 0.3 0.3 - 1.2 mg/dL   GFR, Estimated >60 >60 mL/min   Anion gap 7 5 - 15    Latest Reference Range & Units 07/18/21 10:13  hCG Quant mIU/mL 4,719    IMAGING US OB Transvaginal  Result Date: 07/18/2021 CLINICAL DATA:  Positive UPT 1 week ago, unknown dating Exam: OBSTETRIC <14 WK [place] and TRANSVAGINAL OB US Technique:  Transvaginal ultrasound examination was performed for complete evaluation of the gestation as well at the maternal uterus, adnexal regions, and pelvic cul-de-sac.  Transvaginal technique was performed to assess early pregnancy. Comparison:  none Findings: Normal appearing endometrium without gestational sac Cervix: long and closed Adnexa: right ovary WNL, left ovary with cystic region, without vascularity. Adjacent simple appearing adnexal structure without vascularity Subchorionic hemorrhage:  none Other findings:  No free fluid Impression: Pregnancy of unknown location Recommendations: Stat HCG and trend Treatment/options reviewed with patient. Ectopic precautions reviewed. H/o Csection x 1.    MAU COURSE CBC, CMET, MTX per protocol  MDM Pain and bleeding in early pregnancy  with left ectopic pregnancy and hemodynamically stable. Dx confirmed by Dr. Nelda Marseille. Detailed discussion with pt about Tx options of Methotrexate or Laparoscopy. Explained that ectopic pregnancy is a potentially life-threatening condition. Pt wants to proceed with MTX. The risks of methotrexate were reviewed including failure requiring repeat dosing or eventual surgery. She understands that methotrexate involves  frequent return visits to monitor lab values and that she remains at risk of ectopic rupture until her beta is less than assay. ?The patient opts to proceed with methotrexate.  She has no history of hepatic or renal dysfunction, has normal BUN/Cr/LFT's/platelets.  She is felt to be reliable for follow-up. Side effects of photosensitivity & GI upset were discussed.  She knows to avoid direct sunlight and abstain from alcohol, NSAIDs and sexual intercourse for two weeks. She was counseled to discontinue any MVI with folic acid. ?She understands to follow up on D4 (07/21/21) and D7 (07/24/21) for repeat BHCG and was given the instruction sheet. ?Strict ectopic precautions were reviewed, the patient knows to call with any abdominal pain, vomiting, fainting, or any concerns with her health.  Day 0/1 Day 4 Day 7  Sunday Wednesday Saturday  Monday Thursday Sunday  Tuesday Friday Monday  Wednesday Saturday Tuesday  Thursday Sunday Wednesday  Friday Monday Thursday  Saturday Tuesday Friday    Methotrexate Treatment Protocol for Ectopic Pregnancy  Pretreatment testing and instructions  hCG concentration  Transvaginal ultrasound  Blood group and Rh(D) typing; give Rhogam 300 mcg IM, if indicated  Complete blood count  Liver and renal function tests  Discontinue folic acid supplements  Counsel patient to avoid NSAIDs, recommend acetaminophen if an analgesic is needed  Advise patient to refrain from sexual intercourse and strenuous exercise  Treatment day  Single dose protocol   1  hCG.  Administer  Methotrexate 50 mg/m2 body surface area IM  4  hCG  7  hCG  If <15 percent hCG decline from day 4 to 7, give additional dose of methotrexate 50 mg/m2 IM  If ?15 percent hCG decline from day 4 to 7, draw hCG weekly until undetectable  14  hCG  If <15 percent hCG decline from day 7 to 14, give additional dose of methotrexate 50 mg/m2 IM  If ?15 percent hCG decline from day 7 to 14, check hCG weekly until undetectable  21 and 28  If 3 doses have been given and there is a <15 percent hCG decline from day 21 to 28, proceed with laparoscopic surgery  Laparoscopy  If severe abdominal pain or an acute abdomen suggestive of tubal rupture occurs If ultrasonography reveals greater than 300 mL pelvic or other intraperitoneal fluid  The hCG concentration usually declines to less than 15 mIU/mL by 35 days postinjection but may take as long as 109 days. If the hCG does not decline to zero, a new pregnancy should be excluded; if the hCG is rising, a transvaginal ultrasound should be performed. Alternatively, some patients have a slow clearance of serum hCG. If three weekly values are similar, consider an additional dose of MTX (50 mg/m2) not to exceed the recommended maximum of three total doses. This typically accelerates the decline of serum hCG. The risk of gestational trophoblastic disease is low. Folinic acid rescue is not required for women treated with the single-dose protocol, even if multiple doses are ultimately given.    Prepared with data from:   [facility] Clinical practice. Ectopic pregnancy. [place] Med 2009; 361:379  American College of Obstetricians and Gynecologists. ACOG Practice Bulletin No. 94: Medical management of ectopic pregnancy. Obstet Gynecol 2008; 151:7616.     ASSESSMENT 1. Ectopic pregnancy of left ovary     PLAN Discharge home in stable condition. Ectopic and MTX precautions  Follow-up Information     [facility] Follow up on 07/21/2021.  Specialty: Obstetrics and Gynecology Why: For repeat blood work and possible second dose of Methotrexate or sooner as need in emergenies (Significant abdominal pain) Contact information: [address] [phone] [place] [phone]               Allergies as of 07/18/2021   No Known Allergies      Medication List     STOP taking these medications    Prenatal Vitamin 27-0.8 MG Tabs       TAKE these medications    acetaminophen 500 MG tablet Commonly known as: TYLENOL Take 2 tablets (1,000 mg total) by mouth every 6 (six) hours as needed for mild pain or moderate pain.   lisinopril-hydrochlorothiazide 10-12.5 MG tablet Commonly known as: ZESTORETIC Take 1 tablet by mouth daily.   NIFEdipine 30 MG 24 hr tablet Commonly known as: Procardia XL Take 1 tablet (30 mg total) by mouth daily.   Ozempic (0.25 or 0.5 MG/DOSE) 2 MG/3ML Sopn Generic drug: Semaglutide(0.25 or 0.'5MG'$ /DOS) SMARTSIG:0.3 Milliliter(s) SUB-Q Once a [place], [place], [place] 07/18/2021  6:47 PM  4

## 2021-07-18 NOTE — Patient Instructions (Signed)
Return to care  If you have heavier bleeding that soaks through more than 2 pads per hour for an hour or more If you bleed so much that you feel like you might pass out or you do pass out If you have significant abdominal pain that is not improved with Tylenol   

## 2021-07-18 NOTE — Telephone Encounter (Signed)
Called pt to schedule nurse visit for stat beta HCG lab draw on 07/20/21.

## 2021-07-20 ENCOUNTER — Ambulatory Visit: Payer: Managed Care, Other (non HMO)

## 2021-07-21 ENCOUNTER — Other Ambulatory Visit (HOSPITAL_COMMUNITY)
Admit: 2021-07-21 | Discharge: 2021-07-21 | Disposition: A | Discharge status: Home / Self Care | Payer: Managed Care, Other (non HMO) | Attending: Family Medicine | Admitting: Family Medicine

## 2021-07-21 ENCOUNTER — Inpatient Hospital Stay (EMERGENCY_DEPARTMENT_HOSPITAL)
Admission: AD | Admit: 2021-07-21 | Discharge: 2021-07-21 | Disposition: A | Discharge status: Home / Self Care | Payer: Managed Care, Other (non HMO) | Source: Ambulatory Visit | Attending: Obstetrics & Gynecology | Admitting: Obstetrics & Gynecology

## 2021-07-21 DIAGNOSIS — O00202 Left ovarian pregnancy without intrauterine pregnancy: Secondary | ICD-10-CM | POA: Diagnosis not present

## 2021-07-21 DIAGNOSIS — O00102 Left tubal pregnancy without intrauterine pregnancy: Secondary | ICD-10-CM

## 2021-07-21 LAB — COMPREHENSIVE METABOLIC PANEL
ALT: 16 U/L (ref 0–44)
AST: 15 U/L (ref 15–41)
Albumin: 3.4 g/dL — ABNORMAL LOW (ref 3.5–5.0)
Alkaline Phosphatase: 54 U/L (ref 38–126)
Anion gap: 5 (ref 5–15)
BUN: 12 mg/dL (ref 6–20)
CO2: 27 mmol/L (ref 22–32)
Calcium: 9.3 mg/dL (ref 8.9–10.3)
Chloride: 108 mmol/L (ref 98–111)
Creatinine, Ser: 0.69 mg/dL (ref 0.44–1.00)
GFR, Estimated: >60 mL/min (ref >60)
Glucose, Bld: 102 mg/dL — ABNORMAL HIGH (ref 70–99)
Potassium: 4.1 mmol/L (ref 3.5–5.1)
Sodium: 140 mmol/L (ref 135–145)
Total Bilirubin: 0.2 mg/dL — ABNORMAL LOW (ref 0.3–1.2)
Total Protein: 6.8 g/dL (ref 6.5–8.1)

## 2021-07-21 LAB — HCG, QUANTITATIVE, PREGNANCY: hCG, Beta Chain, Quant, S: 6292 m[IU]/mL — ABNORMAL HIGH (ref <5)

## 2021-07-21 MED ORDER — METHOTREXATE FOR ECTOPIC PREGNANCY
50.0000 mg/m2 | Freq: Once | INTRAMUSCULAR | Status: AC
  Administered 2021-07-21: 100 mg via INTRAMUSCULAR
  Filled 2021-07-21: qty 10

## 2021-07-21 NOTE — MAU Note (Signed)
Notified [facility] of patient's CMP add-on.

## 2021-07-21 NOTE — MAU Provider Note (Signed)
S Ms. KASSIDIE HENDRIKS is a 41 y.o. G2P1001 non-pregnant female who is on day 4 after methotrexate treatment for an ectopic pregnancy, here for repeat HCG. Denies any pain or vaginal bleeding. No physical complaints.   Receives care at [facility], prenatal records reviewed.  Pertinent items noted in HPI and remainder of comprehensive ROS otherwise negative.   O BP 123/70 (BP Location: Right Arm)   Pulse 79   Temp 97.9 F (36.6 C) (Oral)   Resp 15   Ht '4\' 11"'$  (1.499 m)   Wt 209 lb 1.6 oz (94.8 kg)   LMP 06/14/2021   SpO2 97%   BMI 42.23 kg/m  Physical Exam Vitals and nursing note reviewed.  Constitutional:      Appearance: Normal appearance.  HENT:     Head: Normocephalic and atraumatic.  Eyes:     Pupils: Pupils are equal, round, and reactive to light.  Cardiovascular:     Rate and Rhythm: Normal rate and regular rhythm.  Pulmonary:     Effort: Pulmonary effort is normal.  Abdominal:     Palpations: Abdomen is soft.  Musculoskeletal:        General: Normal range of motion.  Skin:    General: Skin is warm and dry.     Capillary Refill: Capillary refill takes less than 2 seconds.  Neurological:     Mental Status: She is alert and oriented to person, place, and time.  Psychiatric:        Mood and Affect: Mood normal.        Behavior: Behavior normal.        Thought Content: Thought content normal.        Judgment: Judgment normal.   MDM/MAU Course:  Results for orders placed or performed during the hospital encounter of 07/21/21 (from the past 24 hour(s))  hCG, quantitative, pregnancy     Status: Abnormal   Collection Time: 07/21/21 10:33 AM  Result Value Ref Range   hCG, Beta Chain, Quant, S 6,292 (H) <5 mIU/mL  Comprehensive metabolic panel     Status: Abnormal   Collection Time: 07/21/21 12:17 PM  Result Value Ref Range   Sodium 140 135 - 145 mmol/L   Potassium 4.1 3.5 - 5.1 mmol/L   Chloride 108 98 - 111 mmol/L   CO2 27 22 - 32 mmol/L   Glucose, Bld 102 (H) 70  - 99 mg/dL   BUN 12 6 - 20 mg/dL   Creatinine, Ser 0.69 0.44 - 1.00 mg/dL   Calcium 9.3 8.9 - 10.3 mg/dL   Total Protein 6.8 6.5 - 8.1 g/dL   Albumin 3.4 (L) 3.5 - 5.0 g/dL   AST 15 15 - 41 U/L   ALT 16 0 - 44 U/L   Alkaline Phosphatase 54 38 - 126 U/L   Total Bilirubin 0.2 (L) 0.3 - 1.2 mg/dL   GFR, Estimated >60 >60 mL/min   Anion gap 5 5 - 15   Quant increased, to repeat MTX dose per Dr. Nelda Marseille. CMP normal and second dose given, well tolerated.  A Ectopic pregnancy Medical screening exam complete  P Discharge from MAU in stable condition with return precautions Follow up as scheduled for trending HCG.  Gabriel Carina, [place] 07/21/2021 3:03 PM

## 2021-07-21 NOTE — MAU Note (Signed)
...  Melissa Spears is a 41 y.o. at Unknown here in MAU reporting: Here for repeat hCG on day 4 post MTX injection. She denies any pain and vaginal bleeding.   Pain score: Denies pain.  Lab orders placed from triage: repeat hCG quantitative

## 2021-07-24 ENCOUNTER — Other Ambulatory Visit (HOSPITAL_COMMUNITY)
Admission: RE | Admit: 2021-07-24 | Discharge: 2021-07-24 | Disposition: A | Discharge status: Home / Self Care | Payer: Managed Care, Other (non HMO) | Source: Ambulatory Visit | Attending: Obstetrics & Gynecology | Admitting: Obstetrics & Gynecology

## 2021-07-24 ENCOUNTER — Inpatient Hospital Stay (HOSPITAL_COMMUNITY)
Admission: AD | Admit: 2021-07-24 | Discharge: 2021-07-24 | Disposition: A | Discharge status: Home / Self Care | Payer: Managed Care, Other (non HMO) | Attending: Family Medicine | Admitting: Family Medicine

## 2021-07-24 DIAGNOSIS — O00102 Left tubal pregnancy without intrauterine pregnancy: Secondary | ICD-10-CM | POA: Insufficient documentation

## 2021-07-24 DIAGNOSIS — K120 Recurrent oral aphthae: Secondary | ICD-10-CM | POA: Diagnosis not present

## 2021-07-24 LAB — HCG, QUANTITATIVE, PREGNANCY: hCG, Beta Chain, Quant, S: 5198 m[IU]/mL — ABNORMAL HIGH (ref <5)

## 2021-07-24 MED ORDER — CHLORHEXIDINE GLUCONATE 0.12 % MT SOLN: 15.0000 mL | Freq: Two times a day (BID) | OROMUCOSAL | 0 refills | Status: DC

## 2021-07-24 NOTE — MAU Provider Note (Signed)
History     CSN: [id number]  Arrival date and time: 07/24/21 0855   Event Date/Time   First Provider Initiated Contact with Patient 07/24/21 0957      Chief Complaint  Patient presents with   Repeat Labs   HPI This is a 41 year old G2 P1-0-0-1 at unknown gestational age.  She presents repeat labs after receiving methotrexate.  She was diagnosed with an ectopic pregnancy on 5/30.  She had a dose of methotrexate and then after a quant of 4700.  She was seen on 6/2 and had a repeat quant of 6300.  She had a second dose of methotrexate at that time.  She is here for repeat hCG.  She does complain of aphthous ulcers in her mouth, sore throat, some discomfort in her right lower quadrant and some vaginal spotting.  OB History     Gravida  2   Para  1   Term  1   Preterm      AB      Living  1      SAB      IAB      Ectopic      Multiple  0   Live Births  1           Past Medical History:  Diagnosis Date   Amenorrhea 2000   Unknown cause   Pituitary adenoma ([place])    Prolactinoma ([place])    Varicose veins     Past Surgical History:  Procedure Laterality Date   CESAREAN SECTION N/A 08/03/2019   Procedure: CESAREAN SECTION;  Surgeon: Christophe Louis, MD;  Location: [place] [facility] ORS;  Service: Obstetrics;  Laterality: N/A;   TONSILLECTOMY AND ADENOIDECTOMY     TONSILLECTOMY AND ADENOIDECTOMY      Family History  Problem Relation Age of Onset   Diabetes Mother    Hypertension Mother    Kidney disease Mother    Heart failure Brother    Hypotension Brother    Heart murmur Brother    Hypertension Maternal Grandmother    Diabetes Maternal Grandmother    Kidney disease Maternal Grandmother    Heart attack Maternal Grandfather     Social History   Tobacco Use   Smoking status: Never   Smokeless tobacco: Never  Vaping Use   Vaping Use: Never used  Substance Use Topics   Alcohol use: No    Alcohol/week: 0.0 standard drinks   Drug use: No    Allergies: No Known  Allergies  Medications Prior to Admission  Medication Sig Dispense Refill Last Dose   acetaminophen (TYLENOL) 500 MG tablet Take 2 tablets (1,000 mg total) by mouth every 6 (six) hours as needed for mild pain or moderate pain. 30 tablet 0    NIFEdipine (PROCARDIA XL) 30 MG 24 hr tablet Take 1 tablet (30 mg total) by mouth daily. 30 tablet 8     Review of Systems Physical Exam   Blood pressure (!) 130/55, temperature 98.4 F (36.9 C), temperature source Oral, resp. rate 17, height '4\' 11"'$  (1.499 m), weight 95 kg, last menstrual period 06/14/2021, SpO2 100 %, unknown if currently breastfeeding.  Physical Exam Vitals reviewed. Exam conducted with a chaperone present.  Constitutional:      Appearance: Normal appearance.  HENT:     Head:     Comments: Oral aphthous ulcer Cardiovascular:     Rate and Rhythm: Normal rate and regular rhythm.     Pulses: Normal pulses.  Pulmonary:  Effort: Pulmonary effort is normal.  Abdominal:     General: Abdomen is flat. There is no distension.     Palpations: Abdomen is soft.     Tenderness: There is no abdominal tenderness.  Skin:    Capillary Refill: Capillary refill takes less than 2 seconds.  Neurological:     General: No focal deficit present.     Mental Status: She is alert.  Psychiatric:        Mood and Affect: Mood normal.        Behavior: Behavior normal.        Thought Content: Thought content normal.        Judgment: Judgment normal.   Results for orders placed or performed during the hospital encounter of 07/24/21 (from the past 24 hour(s))  hCG, quantitative, pregnancy     Status: Abnormal   Collection Time: 07/24/21  9:13 AM  Result Value Ref Range   hCG, Beta Chain, Quant, S 5,198 (H) <5 mIU/mL     MAU Course  Procedures  MDM  Assessment and Plan   1. Left tubal pregnancy without intrauterine pregnancy   2. Aphthous ulcer    hCGs dropped by 18%.  Return precautions given to the patient. We will trial  chlorhexidine for aphthous ulcers. Follow-up next week for women for repeat hCG  Melissa Spears 07/24/2021, 9:57 AM

## 2021-07-24 NOTE — MAU Note (Signed)
...  Melissa Spears is a 42 y.o. at Unknown here in MAU reporting: Here for day 7 repeat labs post MTX injection. She reports she began experiencing bright red spotting this past Friday after leaving MAU and is still experiencing this. She reports she is feeling pressure on her right lower pelvis. She reports she is experiencing a sore throat and mouth sores since this past Friday as well. She reports she has been gargling with Listerine to try and treat her sores. She reports it is the worst in the morning.  Pain score: 4/10 right anterior pelvis  Lab orders placed from triage:  release hCG quantitative

## 2021-07-31 ENCOUNTER — Ambulatory Visit (INDEPENDENT_AMBULATORY_CARE_PROVIDER_SITE_OTHER): Payer: Managed Care, Other (non HMO) | Admitting: *Deleted

## 2021-07-31 VITALS — BP 139/72 | HR 91 | Ht 59.0 in | Wt 210.1 lb

## 2021-07-31 DIAGNOSIS — O00102 Left tubal pregnancy without intrauterine pregnancy: Secondary | ICD-10-CM

## 2021-07-31 NOTE — Progress Notes (Signed)
Here for non stat bhcg today. Denies pain. States is bleeding like a period for about a week. Advised will be notified of results via MyChart and call if necessary. Plan weekly non stat bhcg until bhcg less than 5. Also advised no intercourse or use condoms until levels normal or as advised by provider at provider visit. Will schedule non stat bhcg in one week and provider visit 2-3 weeks at checkout. Advised no PNV , alcohol, ibuprofen until discussed with provider at provider visit. Advised if severe pain to go to MAU . Patient voices understanding. Staci Acosta

## 2021-07-31 NOTE — Addendum Note (Signed)
Addended by: Samuel Germany on: 07/31/2021 04:39 PM   Modules accepted: Orders

## 2021-07-31 NOTE — Patient Instructions (Signed)
Ectopic Pregnancy  An ectopic pregnancy happens when a fertilized egg grows outside of the womb (uterus). Fertilized means that sperm entered the egg. The egg cannot stay alive outside of the womb. What are the causes? The most common cause is damage to a fallopian tube. This damage stops the egg from getting to the womb. Instead, the egg stays in the tube. Sometimes, an ectopic pregnancy happens in other parts of the body. What increases the risk? Getting treatment before to help you have a baby. A past pregnancy outside of the womb. A past surgery to have your tubes tied. Getting pregnant while using a device in the womb to avoid getting pregnant. Taking birth control pills before the age of 64. Smoking or drinking alcohol. Having a mother who took a medicine called DES many years ago. What are the signs or symptoms? Common symptoms of this condition include: Missing a menstrual period. Feeling like you may vomit. Tiredness. Breast pain. Other signs that you are pregnant. Other symptoms may include: Pain during sex. Bleeding from the vagina. Belly pain. A fast heartbeat, low blood pressure, and sweating. Pain or extra pressure while pooping (having a bowel movement). If your tube tears or bursts: You may have sudden and very bad pain in your belly. You may feel dizzy, weak, or light-headed. You may faint. You may have pain in your shoulder or neck. A torn or burst tube is an emergency. It can be life-threatening. How is this treated? This condition may be treated with: Medicine. This may be given if: The pregnancy is found early and you are not bleeding. The tube has not torn or burst. Surgery. This may be done to: Take out the pregnancy tissue. Stop bleeding. Take out part or all of the tube. Take out the womb. This is rare. Blood tests. Careful watching. Follow these instructions at home: Medicines Take over-the-counter and prescription medicines only as told by  your doctor. If told, take steps to prevent problems with pooping (constipation). You may need to: Drink enough fluid to keep your pee (urine) pale yellow. Take medicines. You will be told what medicines to take. Eat foods that are high in fiber. These include beans, whole grains, and fresh fruits and vegetables. Limit foods that are high in fat and sugar. These include fried or sweet foods. Ask your doctor if you should avoid driving or using machines while you are taking your medicine. General instructions Rest or limit your activities, if told to do this. Do not have sex for 6 weeks or as told by your doctor. Do not put tampons, vaginal cleaning wash (douche), or other things in your vagina. Do not use these things for 6 weeks or until your doctor says it is safe to use them. Do not lift anything that is heavier than 10 lb (4.5 kg), or the limit that you are told. Return to your normal activities when your doctor says that it is safe. Keep all follow-up visits. Contact a doctor if: You have a fever or chills. You feel like you may vomit and you vomit. Get help right away if: Your pain gets worse or is not helped by medicine. You feel dizzy or weak. You feel light-headed. You faint. You have sudden and very bad pain in your belly. You have very bad pain in your shoulder or neck. Summary An ectopic pregnancy happens when a fertilized egg grows outside the womb. This is an emergency. The most common cause is damage to one of  the fallopian tubes. This condition may be treated with medicine, surgery, blood tests, or careful watching. This information is not intended to replace advice given to you by your health care provider. Make sure you discuss any questions you have with your health care provider. Document Revised: 05/29/2019 Document Reviewed: 05/19/2019 Elsevier Patient Education  [place].

## 2021-08-01 LAB — BETA HCG QUANT (REF LAB): hCG Quant: 466 m[IU]/mL

## 2021-08-07 ENCOUNTER — Other Ambulatory Visit: Payer: Managed Care, Other (non HMO)

## 2021-08-07 ENCOUNTER — Other Ambulatory Visit: Payer: Self-pay | Admitting: *Deleted

## 2021-08-07 DIAGNOSIS — O00102 Left tubal pregnancy without intrauterine pregnancy: Secondary | ICD-10-CM

## 2021-08-08 LAB — BETA HCG QUANT (REF LAB): hCG Quant: 20 m[IU]/mL

## 2021-08-09 ENCOUNTER — Telehealth: Payer: Self-pay

## 2021-08-09 DIAGNOSIS — Z8759 Personal history of other complications of pregnancy, childbirth and the puerperium: Secondary | ICD-10-CM | POA: Insufficient documentation

## 2021-08-09 DIAGNOSIS — R7303 Prediabetes: Secondary | ICD-10-CM | POA: Insufficient documentation

## 2021-08-09 NOTE — Telephone Encounter (Addendum)
-----   Message from Truett Mainland, DO sent at 08/09/2021  8:11 AM EDT ----- Needs rpt HCG in 2 weeks.  Called pt and left message that I am calling with results and f/u to please give [place] a call back.    Frances Nickels 08/09/21

## 2021-08-10 NOTE — Telephone Encounter (Signed)
Call placed to pt. Spoke with pt. Pt aware of results. Has appt with Gaylan Gerold, CNM on 7/5 for ectopic follow up and will do repeat hcg then. Pt agreeable to plan of care.  Melissa Spears

## 2021-08-15 ENCOUNTER — Telehealth: Payer: Managed Care, Other (non HMO)

## 2021-08-23 ENCOUNTER — Ambulatory Visit: Payer: Managed Care, Other (non HMO) | Admitting: Certified Nurse Midwife

## 2022-05-31 ENCOUNTER — Telehealth: Payer: Self-pay | Admitting: Family Medicine

## 2022-05-31 NOTE — Telephone Encounter (Signed)
Patient called to make an appointment for her Annual,  I scheduled her then she called back to cancel due to her saying she want to go to the [facility] Location and ask for me to cancel her appointment

## 2022-06-03 IMAGING — CT CT RENAL STONE PROTOCOL
2 of 4 series · 17 of 46 positions shown, 19 images · non-contrast
Comparison: None.

CLINICAL DATA: Right flank pain

EXAM:
CT ABDOMEN AND PELVIS WITHOUT CONTRAST
TECHNIQUE: Multidetector CT imaging of the abdomen and pelvis was performed
following the standard protocol without IV contrast.

[Series 2: axial st · axial · 0.98mm/px · z∈[-582,-192]mm · 14 of 86 slices shown, 16 images]
[im 4/86  soft-tissue]
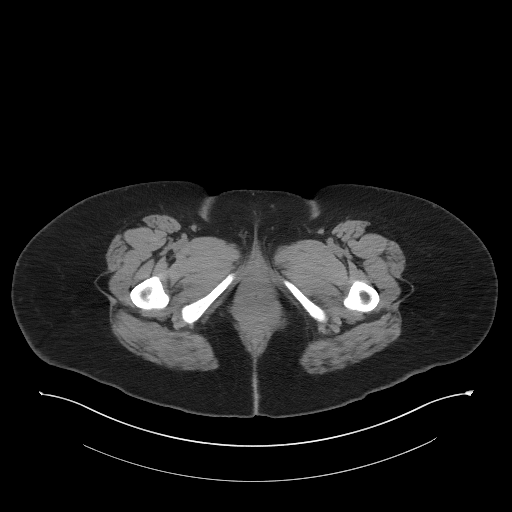
[im 4/86  bone]
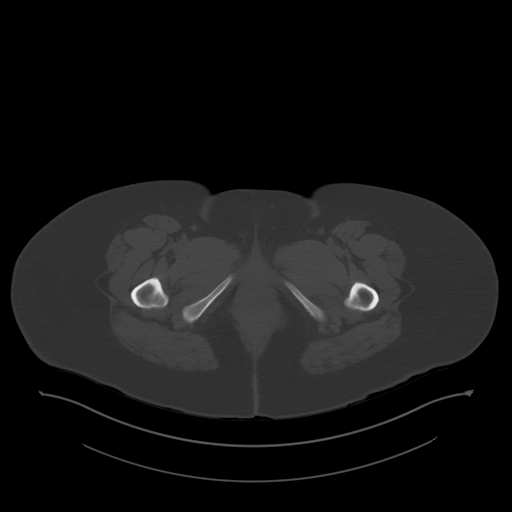
[im 11/86  soft-tissue]
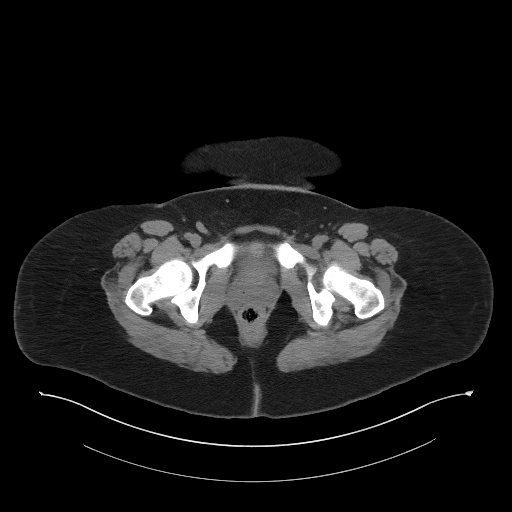
[im 18/86  soft-tissue]
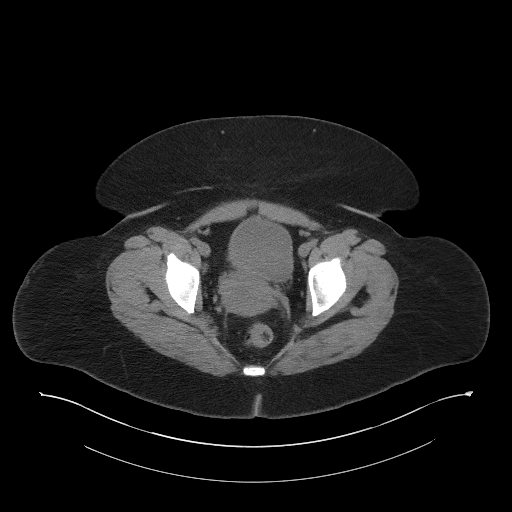
[im 22/86  soft-tissue]
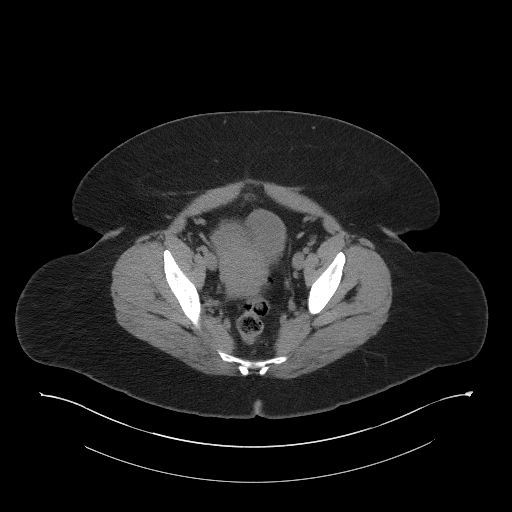
[im 29/86  soft-tissue]
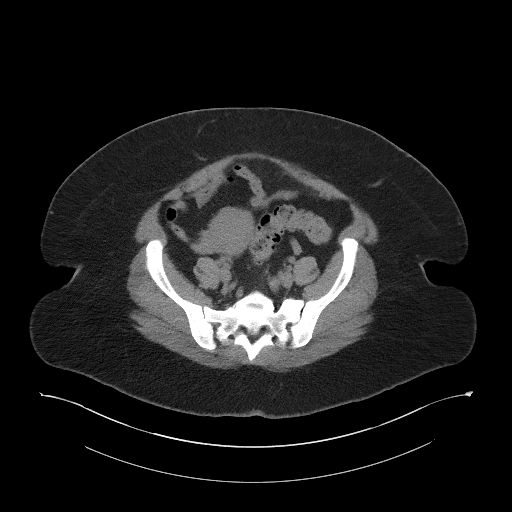
[im 36/86  soft-tissue]
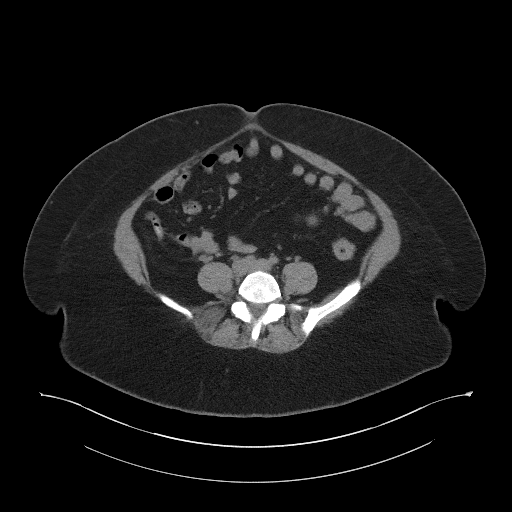
[im 39/86  soft-tissue]
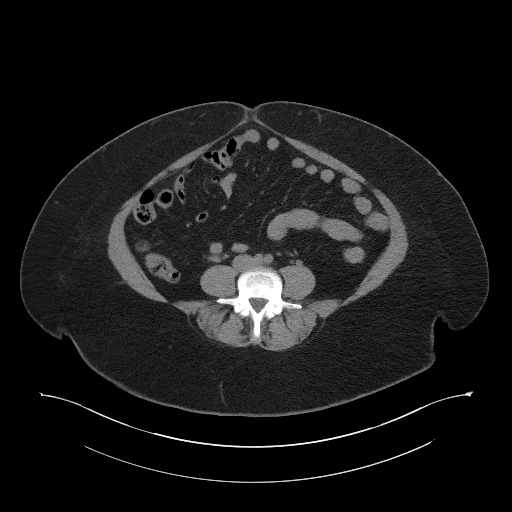
[im 47/86  soft-tissue]
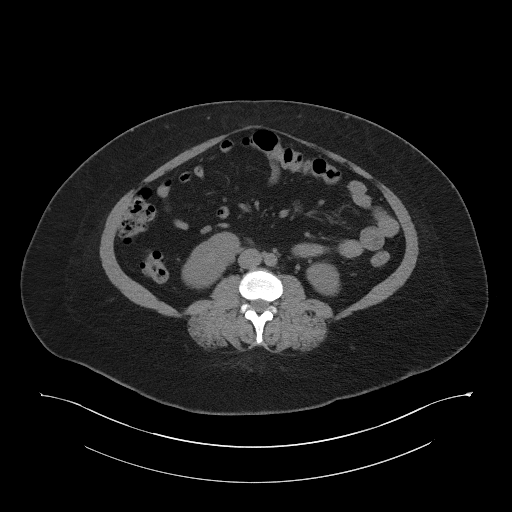
[im 50/86  soft-tissue]
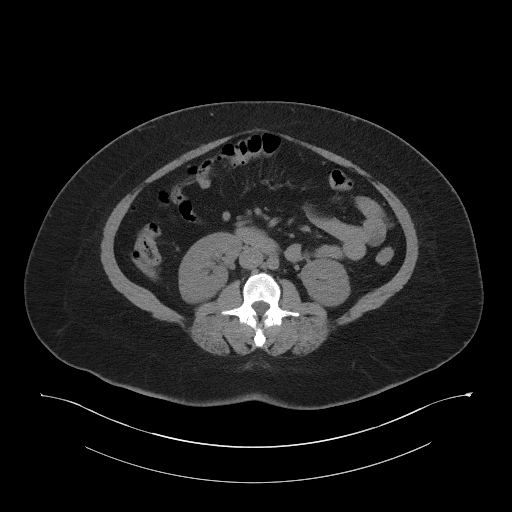
[im 50/86  bone]
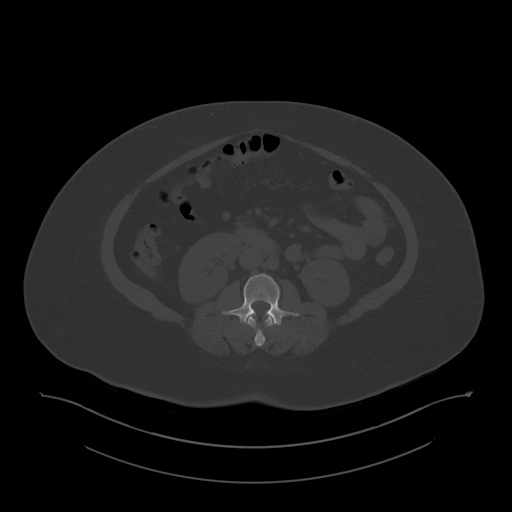
[im 57/86  soft-tissue]
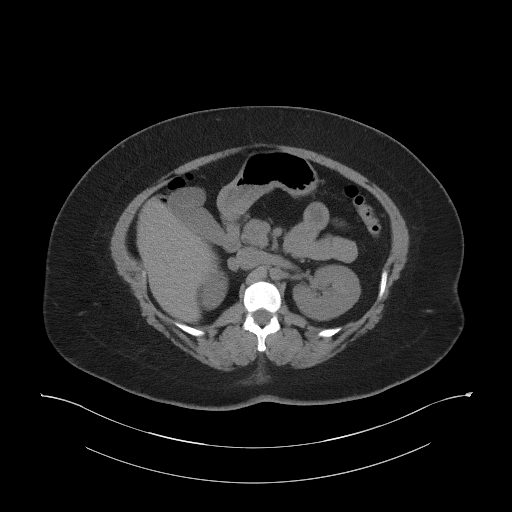
[im 64/86  soft-tissue]
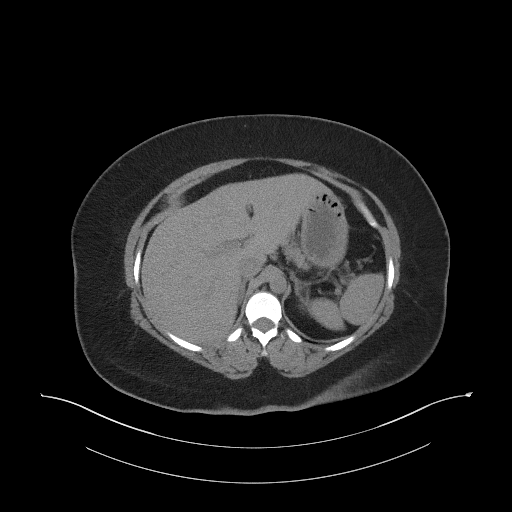
[im 68/86  soft-tissue]
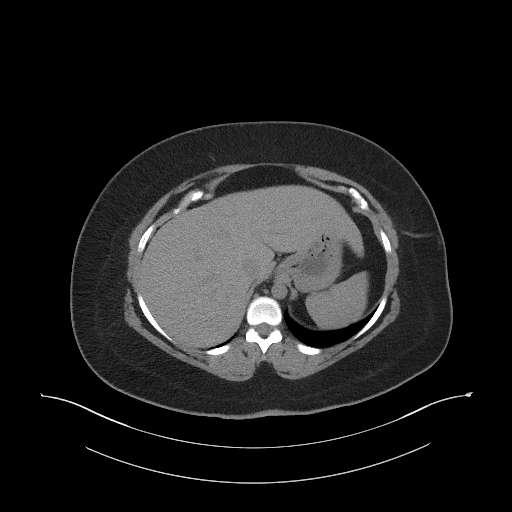
[im 75/86  soft-tissue]
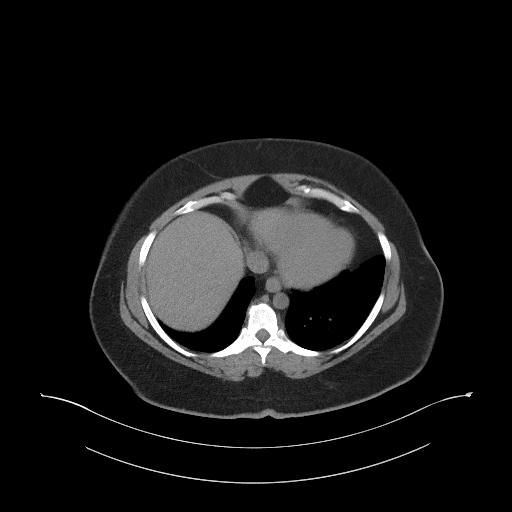
[im 82/86  soft-tissue]
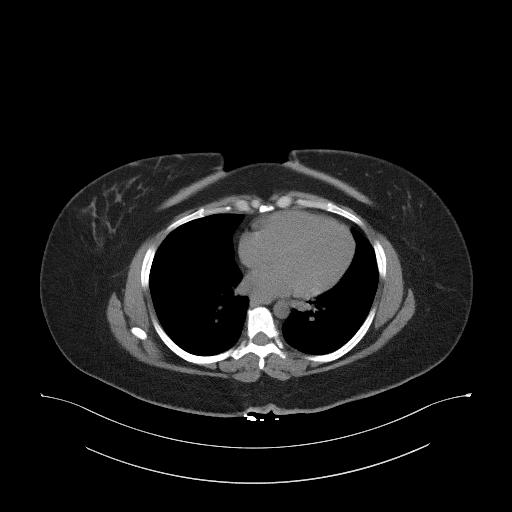

[Series 5: coronal st · coronal · 0.84mm/px · 3 of 110 slices shown]
[im 37/110  soft-tissue]
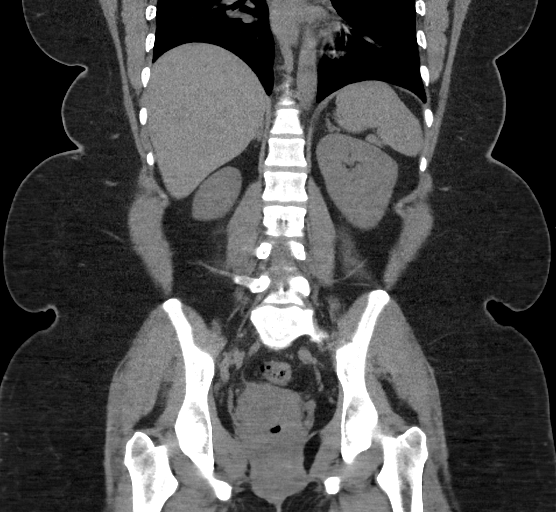
[im 49/110  soft-tissue]
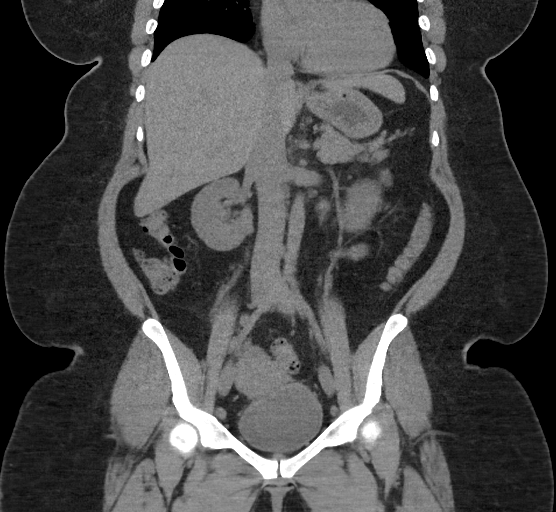
[im 61/110  soft-tissue]
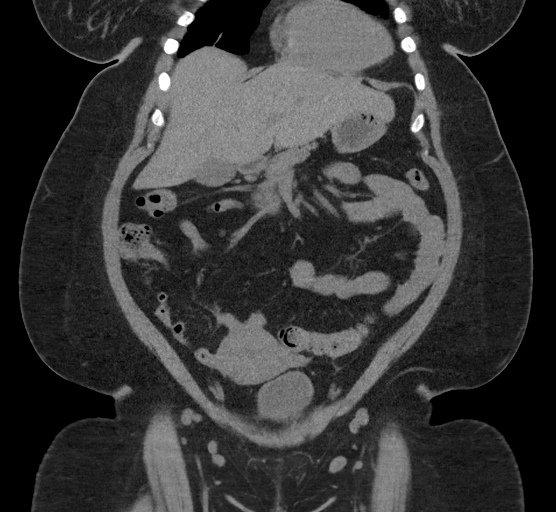

[17 of 46 positions shown; findings below may reference images not displayed]

FINDINGS: Lower chest: No acute abnormality.

Hepatobiliary: No focal hepatic abnormality. Gallbladder
unremarkable.

Pancreas: No focal abnormality or ductal dilatation.

Spleen: No focal abnormality.  Normal size.

Adrenals/Urinary Tract: No adrenal abnormality. No focal renal
abnormality. No stones or hydronephrosis. Urinary bladder is
unremarkable.

Stomach/Bowel: Normal appendix. Few scattered right colonic
diverticula. Stomach and small bowel decompressed, unremarkable.

Vascular/Lymphatic: No evidence of aneurysm or adenopathy.

Reproductive: Uterus and adnexa unremarkable.  No mass.

Other: No free fluid or free air.

Musculoskeletal: No acute bony abnormality.
IMPRESSION: No renal or ureteral stones.  No hydronephrosis.

No acute findings in the abdomen or pelvis.

## 2022-06-29 ENCOUNTER — Encounter: Payer: Self-pay | Admitting: Family Medicine

## 2022-06-29 ENCOUNTER — Telehealth: Payer: Self-pay | Admitting: Clinical

## 2022-06-29 ENCOUNTER — Other Ambulatory Visit (HOSPITAL_COMMUNITY)
Admission: RE | Admit: 2022-06-29 | Discharge: 2022-06-29 | Disposition: A | Discharge status: Home / Self Care | Payer: Managed Care, Other (non HMO) | Source: Ambulatory Visit | Attending: Family Medicine | Admitting: Family Medicine

## 2022-06-29 ENCOUNTER — Ambulatory Visit: Payer: Managed Care, Other (non HMO) | Admitting: Family Medicine

## 2022-06-29 VITALS — BP 127/84 | HR 86 | Ht <= 58 in | Wt 184.0 lb

## 2022-06-29 DIAGNOSIS — R102 Pelvic and perineal pain unspecified side: Secondary | ICD-10-CM

## 2022-06-29 DIAGNOSIS — Z3041 Encounter for surveillance of contraceptive pills: Secondary | ICD-10-CM

## 2022-06-29 DIAGNOSIS — Z1331 Encounter for screening for depression: Secondary | ICD-10-CM

## 2022-06-29 DIAGNOSIS — Z124 Encounter for screening for malignant neoplasm of cervix: Secondary | ICD-10-CM | POA: Insufficient documentation

## 2022-06-29 DIAGNOSIS — Z01419 Encounter for gynecological examination (general) (routine) without abnormal findings: Secondary | ICD-10-CM

## 2022-06-29 DIAGNOSIS — Z1231 Encounter for screening mammogram for malignant neoplasm of breast: Secondary | ICD-10-CM | POA: Diagnosis not present

## 2022-06-29 MED ORDER — NORETHINDRONE 0.35 MG PO TABS: 1.0000 | ORAL_TABLET | Freq: Every day | ORAL | 3 refills | Status: AC

## 2022-06-29 NOTE — Progress Notes (Signed)
Subjective:     Melissa Spears is a 42 y.o. female and is here for a comprehensive physical exam. The patient reports problems - pain . Sharp pain on right side of abdomen. There since 07/2021. Nothing makes it better or worse. It feels annoying and not sharp. Feels like cycle might start. On POPs and doing well, but on Ozempic and concerned that this might cause delayed absorption. Ok with pregnancy, does not want more permanent method. Has not seen Endocrinology or Ophtho in some time   The following portions of the patient's history were reviewed and updated as appropriate: allergies, current medications, past family history, past medical history, past social history, past surgical history, and problem list.  Review of Systems Pertinent items noted in HPI and remainder of comprehensive ROS otherwise negative.   Objective:    BP 127/84   Pulse 86   Ht 4\' 10"  (1.473 m)   Wt 184 lb (83.5 kg)   LMP 06/14/2021   Breastfeeding No   BMI 38.46 kg/m  General appearance: alert, cooperative, and appears stated age Head: Normocephalic, without obvious abnormality, atraumatic Neck: no adenopathy, supple, symmetrical, trachea midline, and thyroid not enlarged, symmetric, no tenderness/mass/nodules Lungs: clear to auscultation bilaterally Breasts: normal appearance, no masses or tenderness Heart: regular rate and rhythm, S1, S2 normal, no murmur, click, rub or gallop Abdomen: soft, non-tender; bowel sounds normal; no masses,  no organomegaly Pelvic: cervix normal in appearance, external genitalia normal, no adnexal masses or tenderness, no cervical motion tenderness, uterus normal size, shape, and consistency, and vagina normal without discharge Extremities: extremities normal, atraumatic, no cyanosis or edema Pulses: 2+ and symmetric Skin: Skin color, texture, turgor normal. No rashes or lesions Lymph nodes: Cervical, supraclavicular, and axillary nodes normal. Neurologic: Grossly normal     Assessment:    GYN female exam.      Plan:   Problem List Items Addressed This Visit   None Visit Diagnoses     Encounter for gynecological examination without abnormal finding    -  Primary   [phone]   Screening for malignant neoplasm of cervix       [phone]   Relevant Orders   Cytology - PAP( [place])   Encounter for screening mammogram for malignant neoplasm of breast       [phone]   Relevant Orders   MM 3D SCREENING MAMMOGRAM BILATERAL BREAST   Positive screening for depression on 9-item Patient Health Questionnaire (PHQ-9)       [phone] referral to [facility]   Relevant Orders   Ambulatory referral to [facility]   Pelvic pain       [id number] -  check pelvic sonogram to ensure no pelvic pathology   Relevant Orders   US PELVIC COMPLETE WITH TRANSVAGINAL   Oral contraceptive pill surveillance       [phone] - refilled pills and understand she might get pregnanct due to Ozempic   Relevant Medications   norethindrone (MICRONOR) 0.35 MG tablet      Return in 1 year (on 06/29/2023).    See After Visit Summary for Counseling Recommendations

## 2022-06-29 NOTE — Progress Notes (Deleted)
Subjective:     Melissa Spears is a 42 y.o. female and is here for a comprehensive physical exam. The patient reports {problems:16946}.  Social History   Socioeconomic History  . Marital status: Media planner    Spouse name: Not on file  . Number of children: Not on file  . Years of education: Not on file  . Highest education level: Not on file  Occupational History  . Occupation: Lobbyist: Designer, jewellery  . 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 Smoking status: Never  . Smokeless tobacco: Never  Vaping Use  . Vaping Use: Never used  Substance and Sexual Activity  . Alcohol use: No    Alcohol/week: 0.0 standard drinks of alcohol  . Drug use: No  . Sexual activity: Yes  Other Topics Concern  . Not on file  Social History Narrative   Exercise-- tae bo   Social Determinants of Health   Financial Resource Strain: Not on file  Food Insecurity: Not on file  Transportation Needs: Not on file  Physical Activity: Not on file  Stress: Not on file  Social Connections: Not on file  Intimate Partner Violence: Not on file   Health Maintenance  Topic Date Due  . COVID-19 Vaccine (1) Never done  . Hepatitis C Screening  Never done  . PAP SMEAR-Modifier  06/14/2017  . INFLUENZA VACCINE  09/20/2022  . DTaP/Tdap/Td (2 - Td or Tdap) 06/14/2024  . HIV Screening  Completed  . HPV VACCINES  Aged Out    {Common ambulatory SmartLinks:19316}  Review of Systems {ros; complete:30496}   Objective:    {Exam, [phone]}    Assessment:    Healthy female exam. ***     Plan:     See After Visit Summary for Counseling Recommendations

## 2022-06-29 NOTE — Telephone Encounter (Signed)
Attempt call regarding referral; Left HIPPA-compliant message to call back Traeger Sultana from [facility] at  [phone] (Jordell Outten's office).   

## 2022-06-29 NOTE — Patient Instructions (Signed)

## 2022-07-02 ENCOUNTER — Ambulatory Visit: Payer: Managed Care, Other (non HMO) | Admitting: Family Medicine

## 2022-07-02 LAB — CYTOLOGY - PAP
Comment: NEGATIVE
Diagnosis: NEGATIVE
High risk HPV: NEGATIVE

## 2022-07-03 ENCOUNTER — Inpatient Hospital Stay (HOSPITAL_BASED_OUTPATIENT_CLINIC_OR_DEPARTMENT_OTHER): Admission: RE | Admit: 2022-07-03 | Payer: Managed Care, Other (non HMO) | Source: Ambulatory Visit

## 2022-07-03 ENCOUNTER — Ambulatory Visit (HOSPITAL_BASED_OUTPATIENT_CLINIC_OR_DEPARTMENT_OTHER): Payer: Managed Care, Other (non HMO)
